# Patient Record
Sex: Female | Born: 1950 | Race: White | Hispanic: No | Smoking: Never smoker
Health system: Southern US, Community
[De-identification: ages and names within clinical notes are randomized; demographics above are authoritative.]

## PROBLEM LIST (undated history)

## (undated) DIAGNOSIS — M549 Dorsalgia, unspecified: Secondary | ICD-10-CM

## (undated) DIAGNOSIS — F419 Anxiety disorder, unspecified: Secondary | ICD-10-CM

## (undated) DIAGNOSIS — F329 Major depressive disorder, single episode, unspecified: Secondary | ICD-10-CM

## (undated) DIAGNOSIS — K219 Gastro-esophageal reflux disease without esophagitis: Secondary | ICD-10-CM

## (undated) DIAGNOSIS — J189 Pneumonia, unspecified organism: Secondary | ICD-10-CM

## (undated) DIAGNOSIS — I739 Peripheral vascular disease, unspecified: Secondary | ICD-10-CM

## (undated) DIAGNOSIS — R51 Headache: Secondary | ICD-10-CM

## (undated) DIAGNOSIS — T7840XA Allergy, unspecified, initial encounter: Secondary | ICD-10-CM

## (undated) DIAGNOSIS — M797 Fibromyalgia: Secondary | ICD-10-CM

## (undated) DIAGNOSIS — I639 Cerebral infarction, unspecified: Secondary | ICD-10-CM

## (undated) DIAGNOSIS — J45909 Unspecified asthma, uncomplicated: Secondary | ICD-10-CM

## (undated) DIAGNOSIS — I2109 ST elevation (STEMI) myocardial infarction involving other coronary artery of anterior wall: Secondary | ICD-10-CM

## (undated) DIAGNOSIS — G709 Myoneural disorder, unspecified: Secondary | ICD-10-CM

## (undated) DIAGNOSIS — F32A Depression, unspecified: Secondary | ICD-10-CM

## (undated) DIAGNOSIS — G8929 Other chronic pain: Secondary | ICD-10-CM

## (undated) DIAGNOSIS — R5383 Other fatigue: Secondary | ICD-10-CM

## (undated) DIAGNOSIS — M1990 Unspecified osteoarthritis, unspecified site: Secondary | ICD-10-CM

## (undated) DIAGNOSIS — I2510 Atherosclerotic heart disease of native coronary artery without angina pectoris: Secondary | ICD-10-CM

## (undated) DIAGNOSIS — Z9289 Personal history of other medical treatment: Secondary | ICD-10-CM

## (undated) DIAGNOSIS — E785 Hyperlipidemia, unspecified: Secondary | ICD-10-CM

## (undated) DIAGNOSIS — K5900 Constipation, unspecified: Secondary | ICD-10-CM

## (undated) DIAGNOSIS — I10 Essential (primary) hypertension: Secondary | ICD-10-CM

## (undated) DIAGNOSIS — D649 Anemia, unspecified: Secondary | ICD-10-CM

## (undated) HISTORY — DX: Other fatigue: R53.83

## (undated) HISTORY — DX: ST elevation (STEMI) myocardial infarction involving other coronary artery of anterior wall: I21.09

## (undated) HISTORY — PX: HERNIA REPAIR: SHX51

## (undated) HISTORY — DX: Constipation, unspecified: K59.00

## (undated) HISTORY — DX: Essential (primary) hypertension: I10

## (undated) HISTORY — PX: COSMETIC SURGERY: SHX468

## (undated) HISTORY — DX: Personal history of other medical treatment: Z92.89

## (undated) HISTORY — PX: ABDOMINAL HYSTERECTOMY: SHX81

## (undated) HISTORY — DX: Allergy, unspecified, initial encounter: T78.40XA

---

## 1983-07-09 HISTORY — PX: OTHER SURGICAL HISTORY: SHX169

## 1983-07-09 HISTORY — PX: NOSE SURGERY: SHX723

## 1997-10-31 ENCOUNTER — Emergency Department (HOSPITAL_COMMUNITY): Admission: EM | Admit: 1997-10-31 | Discharge: 1997-10-31 | Payer: Self-pay | Admitting: Emergency Medicine

## 1998-02-09 ENCOUNTER — Emergency Department (HOSPITAL_COMMUNITY): Admission: EM | Admit: 1998-02-09 | Discharge: 1998-02-09 | Payer: Self-pay | Admitting: Emergency Medicine

## 1998-03-02 ENCOUNTER — Encounter: Admission: RE | Admit: 1998-03-02 | Discharge: 1998-03-02 | Payer: Self-pay | Admitting: *Deleted

## 1998-09-13 ENCOUNTER — Emergency Department (HOSPITAL_COMMUNITY): Admission: EM | Admit: 1998-09-13 | Discharge: 1998-09-13 | Payer: Self-pay | Admitting: Emergency Medicine

## 1998-09-14 ENCOUNTER — Encounter: Payer: Self-pay | Admitting: Emergency Medicine

## 1998-10-03 ENCOUNTER — Encounter: Payer: Self-pay | Admitting: Emergency Medicine

## 1998-10-03 ENCOUNTER — Emergency Department (HOSPITAL_COMMUNITY): Admission: EM | Admit: 1998-10-03 | Discharge: 1998-10-03 | Payer: Self-pay | Admitting: Emergency Medicine

## 1998-11-12 ENCOUNTER — Emergency Department (HOSPITAL_COMMUNITY): Admission: EM | Admit: 1998-11-12 | Discharge: 1998-11-12 | Payer: Self-pay | Admitting: Internal Medicine

## 1999-04-01 ENCOUNTER — Emergency Department (HOSPITAL_COMMUNITY): Admission: EM | Admit: 1999-04-01 | Discharge: 1999-04-01 | Payer: Self-pay | Admitting: Emergency Medicine

## 1999-04-21 ENCOUNTER — Emergency Department (HOSPITAL_COMMUNITY): Admission: EM | Admit: 1999-04-21 | Discharge: 1999-04-21 | Payer: Self-pay | Admitting: Emergency Medicine

## 1999-04-27 ENCOUNTER — Ambulatory Visit (HOSPITAL_COMMUNITY): Admission: RE | Admit: 1999-04-27 | Discharge: 1999-04-27 | Payer: Self-pay | Admitting: Internal Medicine

## 1999-04-27 ENCOUNTER — Encounter: Payer: Self-pay | Admitting: Internal Medicine

## 1999-05-02 ENCOUNTER — Emergency Department (HOSPITAL_COMMUNITY): Admission: EM | Admit: 1999-05-02 | Discharge: 1999-05-02 | Payer: Self-pay | Admitting: Emergency Medicine

## 1999-09-21 ENCOUNTER — Ambulatory Visit (HOSPITAL_COMMUNITY): Admission: RE | Admit: 1999-09-21 | Discharge: 1999-09-21 | Payer: Self-pay | Admitting: Internal Medicine

## 1999-10-09 ENCOUNTER — Emergency Department (HOSPITAL_COMMUNITY): Admission: EM | Admit: 1999-10-09 | Discharge: 1999-10-09 | Payer: Self-pay | Admitting: Emergency Medicine

## 2000-04-17 ENCOUNTER — Emergency Department (HOSPITAL_COMMUNITY): Admission: EM | Admit: 2000-04-17 | Discharge: 2000-04-17 | Payer: Self-pay | Admitting: Emergency Medicine

## 2000-08-31 ENCOUNTER — Emergency Department (HOSPITAL_COMMUNITY): Admission: EM | Admit: 2000-08-31 | Discharge: 2000-08-31 | Payer: Self-pay | Admitting: Emergency Medicine

## 2001-03-02 ENCOUNTER — Emergency Department (HOSPITAL_COMMUNITY): Admission: EM | Admit: 2001-03-02 | Discharge: 2001-03-02 | Payer: Self-pay | Admitting: Emergency Medicine

## 2001-04-01 ENCOUNTER — Ambulatory Visit (HOSPITAL_COMMUNITY): Admission: RE | Admit: 2001-04-01 | Discharge: 2001-04-01 | Payer: Self-pay | Admitting: Family Medicine

## 2001-04-01 ENCOUNTER — Encounter: Payer: Self-pay | Admitting: Family Medicine

## 2001-04-01 ENCOUNTER — Emergency Department (HOSPITAL_COMMUNITY): Admission: EM | Admit: 2001-04-01 | Discharge: 2001-04-01 | Payer: Self-pay | Admitting: Emergency Medicine

## 2001-04-19 ENCOUNTER — Emergency Department (HOSPITAL_COMMUNITY): Admission: EM | Admit: 2001-04-19 | Discharge: 2001-04-19 | Payer: Self-pay | Admitting: Emergency Medicine

## 2001-05-01 ENCOUNTER — Emergency Department (HOSPITAL_COMMUNITY): Admission: EM | Admit: 2001-05-01 | Discharge: 2001-05-01 | Payer: Self-pay | Admitting: Emergency Medicine

## 2003-07-09 DIAGNOSIS — I2510 Atherosclerotic heart disease of native coronary artery without angina pectoris: Secondary | ICD-10-CM

## 2003-07-09 HISTORY — DX: Atherosclerotic heart disease of native coronary artery without angina pectoris: I25.10

## 2003-07-27 ENCOUNTER — Emergency Department (HOSPITAL_COMMUNITY): Admission: EM | Admit: 2003-07-27 | Discharge: 2003-07-28 | Payer: Self-pay | Admitting: Emergency Medicine

## 2003-08-16 ENCOUNTER — Inpatient Hospital Stay (HOSPITAL_COMMUNITY): Admission: EM | Admit: 2003-08-16 | Discharge: 2003-08-17 | Payer: Self-pay | Admitting: Emergency Medicine

## 2003-08-16 HISTORY — PX: CARDIAC CATHETERIZATION: SHX172

## 2003-08-22 ENCOUNTER — Encounter (INDEPENDENT_AMBULATORY_CARE_PROVIDER_SITE_OTHER): Payer: Self-pay | Admitting: *Deleted

## 2003-08-22 ENCOUNTER — Inpatient Hospital Stay (HOSPITAL_COMMUNITY): Admission: EM | Admit: 2003-08-22 | Discharge: 2003-08-23 | Payer: Self-pay | Admitting: Emergency Medicine

## 2003-08-23 HISTORY — PX: CARDIAC CATHETERIZATION: SHX172

## 2003-10-13 ENCOUNTER — Emergency Department (HOSPITAL_COMMUNITY): Admission: EM | Admit: 2003-10-13 | Discharge: 2003-10-13 | Payer: Self-pay

## 2003-10-14 ENCOUNTER — Ambulatory Visit (HOSPITAL_COMMUNITY): Admission: RE | Admit: 2003-10-14 | Discharge: 2003-10-14 | Payer: Self-pay

## 2003-11-22 ENCOUNTER — Inpatient Hospital Stay (HOSPITAL_COMMUNITY): Admission: EM | Admit: 2003-11-22 | Discharge: 2003-11-23 | Payer: Self-pay | Admitting: Emergency Medicine

## 2003-11-22 HISTORY — PX: CARDIAC CATHETERIZATION: SHX172

## 2004-03-06 ENCOUNTER — Emergency Department (HOSPITAL_COMMUNITY): Admission: EM | Admit: 2004-03-06 | Discharge: 2004-03-06 | Payer: Self-pay | Admitting: Emergency Medicine

## 2004-06-27 ENCOUNTER — Emergency Department (HOSPITAL_COMMUNITY): Admission: EM | Admit: 2004-06-27 | Discharge: 2004-06-27 | Payer: Self-pay | Admitting: Emergency Medicine

## 2004-09-03 ENCOUNTER — Inpatient Hospital Stay (HOSPITAL_COMMUNITY): Admission: EM | Admit: 2004-09-03 | Discharge: 2004-09-04 | Payer: Self-pay | Admitting: Emergency Medicine

## 2004-09-30 ENCOUNTER — Emergency Department (HOSPITAL_COMMUNITY): Admission: EM | Admit: 2004-09-30 | Discharge: 2004-10-01 | Payer: Self-pay | Admitting: Emergency Medicine

## 2004-11-15 ENCOUNTER — Emergency Department (HOSPITAL_COMMUNITY): Admission: EM | Admit: 2004-11-15 | Discharge: 2004-11-15 | Payer: Self-pay | Admitting: Emergency Medicine

## 2005-01-16 ENCOUNTER — Emergency Department (HOSPITAL_COMMUNITY): Admission: EM | Admit: 2005-01-16 | Discharge: 2005-01-16 | Payer: Self-pay | Admitting: Emergency Medicine

## 2005-02-16 ENCOUNTER — Emergency Department (HOSPITAL_COMMUNITY): Admission: EM | Admit: 2005-02-16 | Discharge: 2005-02-16 | Payer: Self-pay | Admitting: Emergency Medicine

## 2005-06-12 ENCOUNTER — Inpatient Hospital Stay (HOSPITAL_COMMUNITY): Admission: EM | Admit: 2005-06-12 | Discharge: 2005-06-14 | Payer: Self-pay | Admitting: Emergency Medicine

## 2005-06-13 HISTORY — PX: CARDIAC CATHETERIZATION: SHX172

## 2005-07-16 ENCOUNTER — Emergency Department (HOSPITAL_COMMUNITY): Admission: EM | Admit: 2005-07-16 | Discharge: 2005-07-16 | Payer: Self-pay | Admitting: Emergency Medicine

## 2005-10-06 ENCOUNTER — Emergency Department (HOSPITAL_COMMUNITY): Admission: EM | Admit: 2005-10-06 | Discharge: 2005-10-06 | Payer: Self-pay | Admitting: Emergency Medicine

## 2008-06-29 ENCOUNTER — Emergency Department (HOSPITAL_COMMUNITY): Admission: EM | Admit: 2008-06-29 | Discharge: 2008-06-29 | Payer: Self-pay | Admitting: Emergency Medicine

## 2008-07-15 ENCOUNTER — Ambulatory Visit: Payer: Self-pay | Admitting: Internal Medicine

## 2008-07-18 ENCOUNTER — Emergency Department (HOSPITAL_COMMUNITY): Admission: EM | Admit: 2008-07-18 | Discharge: 2008-07-18 | Payer: Self-pay | Admitting: Family Medicine

## 2008-08-26 ENCOUNTER — Ambulatory Visit: Payer: Self-pay | Admitting: Family Medicine

## 2008-09-07 ENCOUNTER — Encounter: Payer: Self-pay | Admitting: Family Medicine

## 2008-09-07 ENCOUNTER — Ambulatory Visit (HOSPITAL_COMMUNITY): Admission: RE | Admit: 2008-09-07 | Discharge: 2008-09-07 | Payer: Self-pay | Admitting: Family Medicine

## 2008-09-07 ENCOUNTER — Ambulatory Visit: Payer: Self-pay | Admitting: *Deleted

## 2008-09-13 ENCOUNTER — Ambulatory Visit: Payer: Self-pay | Admitting: Internal Medicine

## 2008-09-13 ENCOUNTER — Encounter: Payer: Self-pay | Admitting: Family Medicine

## 2008-09-13 LAB — CONVERTED CEMR LAB
ALT: 19 U/L (ref 0–35)
AST: 20 U/L (ref 0–37)
Albumin: 4.3 g/dL (ref 3.5–5.2)
Alkaline Phosphatase: 131 U/L — ABNORMAL HIGH (ref 39–117)
BUN: 16 mg/dL (ref 6–23)
Band Neutrophils: 0 % (ref 0–10)
Basophils Absolute: 0 K/uL (ref 0.0–0.1)
Basophils Relative: 0 % (ref 0–1)
CO2: 20 meq/L (ref 19–32)
Calcium: 9.2 mg/dL (ref 8.4–10.5)
Chloride: 105 meq/L (ref 96–112)
Cholesterol: 207 mg/dL — ABNORMAL HIGH (ref 0–200)
Creatinine, Ser: 0.85 mg/dL (ref 0.40–1.20)
Eosinophils Absolute: 1 K/uL — ABNORMAL HIGH (ref 0.0–0.7)
Eosinophils Relative: 9 % — ABNORMAL HIGH (ref 0–5)
Glucose, Bld: 95 mg/dL (ref 70–99)
HCT: 42.8 % (ref 36.0–46.0)
HDL: 70 mg/dL (ref >39)
Hemoglobin: 13.8 g/dL (ref 12.0–15.0)
LDL Cholesterol: 115 mg/dL — ABNORMAL HIGH (ref 0–99)
Lymphocytes Relative: 26 % (ref 12–46)
Lymphs Abs: 2.8 K/uL (ref 0.7–4.0)
MCHC: 32.2 g/dL (ref 30.0–36.0)
MCV: 93.4 fL (ref 78.0–100.0)
Monocytes Absolute: 0.6 K/uL (ref 0.1–1.0)
Monocytes Relative: 5 % (ref 3–12)
Neutro Abs: 6.1 K/uL (ref 1.7–7.7)
Neutrophils Relative %: 58 % (ref 43–77)
Platelets: 501 K/uL — ABNORMAL HIGH (ref 150–400)
Potassium: 4.3 meq/L (ref 3.5–5.3)
Pro B Natriuretic peptide (BNP): 11.6 pg/mL (ref 0.0–100.0)
RBC: 4.58 M/uL (ref 3.87–5.11)
RDW: 14.4 % (ref 11.5–15.5)
Sodium: 140 meq/L (ref 135–145)
TSH: 4.266 u[IU]/mL (ref 0.350–4.500)
Total Bilirubin: 0.4 mg/dL (ref 0.3–1.2)
Total CHOL/HDL Ratio: 3
Total Protein: 7.1 g/dL (ref 6.0–8.3)
Triglycerides: 112 mg/dL (ref <150)
VLDL: 22 mg/dL (ref 0–40)
Vit D, 25-Hydroxy: 21 ng/mL — ABNORMAL LOW (ref 30–89)
Vitamin B-12: 485 pg/mL (ref 211–911)
WBC: 10.5 10*3/microliter (ref 4.0–10.5)

## 2008-09-21 ENCOUNTER — Ambulatory Visit: Payer: Self-pay | Admitting: Internal Medicine

## 2008-09-22 ENCOUNTER — Ambulatory Visit: Payer: Self-pay | Admitting: *Deleted

## 2008-09-28 ENCOUNTER — Emergency Department (HOSPITAL_COMMUNITY): Admission: EM | Admit: 2008-09-28 | Discharge: 2008-09-28 | Payer: Self-pay | Admitting: Emergency Medicine

## 2008-10-07 ENCOUNTER — Emergency Department (HOSPITAL_COMMUNITY): Admission: EM | Admit: 2008-10-07 | Discharge: 2008-10-07 | Payer: Self-pay | Admitting: Emergency Medicine

## 2008-10-12 ENCOUNTER — Emergency Department (HOSPITAL_COMMUNITY): Admission: EM | Admit: 2008-10-12 | Discharge: 2008-10-12 | Payer: Self-pay | Admitting: Emergency Medicine

## 2008-11-03 ENCOUNTER — Ambulatory Visit: Payer: Self-pay | Admitting: Internal Medicine

## 2008-12-07 ENCOUNTER — Observation Stay (HOSPITAL_COMMUNITY): Admission: EM | Admit: 2008-12-07 | Discharge: 2008-12-08 | Payer: Self-pay | Admitting: Emergency Medicine

## 2008-12-10 ENCOUNTER — Ambulatory Visit: Payer: Self-pay | Admitting: *Deleted

## 2008-12-10 ENCOUNTER — Inpatient Hospital Stay (HOSPITAL_COMMUNITY): Admission: AD | Admit: 2008-12-10 | Discharge: 2008-12-14 | Payer: Self-pay | Admitting: *Deleted

## 2008-12-12 ENCOUNTER — Ambulatory Visit (HOSPITAL_COMMUNITY): Admission: RE | Admit: 2008-12-12 | Discharge: 2008-12-12 | Payer: Self-pay | Admitting: *Deleted

## 2009-01-02 ENCOUNTER — Ambulatory Visit: Payer: Self-pay | Admitting: Internal Medicine

## 2009-01-17 ENCOUNTER — Emergency Department (HOSPITAL_COMMUNITY): Admission: EM | Admit: 2009-01-17 | Discharge: 2009-01-17 | Payer: Self-pay | Admitting: Ophthalmology

## 2009-02-01 ENCOUNTER — Ambulatory Visit: Payer: Self-pay | Admitting: Internal Medicine

## 2009-02-14 ENCOUNTER — Emergency Department (HOSPITAL_COMMUNITY): Admission: EM | Admit: 2009-02-14 | Discharge: 2009-02-15 | Payer: Self-pay | Admitting: Emergency Medicine

## 2009-02-15 ENCOUNTER — Telehealth (INDEPENDENT_AMBULATORY_CARE_PROVIDER_SITE_OTHER): Payer: Self-pay | Admitting: *Deleted

## 2009-02-15 ENCOUNTER — Ambulatory Visit: Payer: Self-pay | Admitting: Internal Medicine

## 2009-02-15 ENCOUNTER — Encounter (INDEPENDENT_AMBULATORY_CARE_PROVIDER_SITE_OTHER): Payer: Self-pay | Admitting: Adult Health

## 2009-02-15 LAB — CONVERTED CEMR LAB

## 2009-02-16 ENCOUNTER — Encounter (INDEPENDENT_AMBULATORY_CARE_PROVIDER_SITE_OTHER): Payer: Self-pay | Admitting: Adult Health

## 2009-05-31 ENCOUNTER — Ambulatory Visit: Payer: Self-pay | Admitting: Family Medicine

## 2009-05-31 ENCOUNTER — Encounter: Payer: Self-pay | Admitting: Family Medicine

## 2009-05-31 LAB — CONVERTED CEMR LAB: Microalb, Ur: 3.72 mg/dL — ABNORMAL HIGH (ref 0.00–1.89)

## 2009-07-12 ENCOUNTER — Ambulatory Visit: Payer: Self-pay | Admitting: Internal Medicine

## 2009-07-20 ENCOUNTER — Encounter (INDEPENDENT_AMBULATORY_CARE_PROVIDER_SITE_OTHER): Payer: Self-pay | Admitting: *Deleted

## 2009-08-04 ENCOUNTER — Ambulatory Visit (HOSPITAL_COMMUNITY): Admission: RE | Admit: 2009-08-04 | Discharge: 2009-08-04 | Payer: Self-pay | Admitting: Internal Medicine

## 2009-08-14 ENCOUNTER — Ambulatory Visit: Payer: Self-pay | Admitting: Gastroenterology

## 2009-08-14 DIAGNOSIS — K602 Anal fissure, unspecified: Secondary | ICD-10-CM

## 2009-08-17 ENCOUNTER — Ambulatory Visit: Payer: Self-pay | Admitting: Gastroenterology

## 2010-04-12 ENCOUNTER — Emergency Department (HOSPITAL_COMMUNITY): Admission: EM | Admit: 2010-04-12 | Discharge: 2010-04-12 | Payer: Self-pay | Admitting: Emergency Medicine

## 2010-05-19 ENCOUNTER — Inpatient Hospital Stay (HOSPITAL_COMMUNITY)
Admission: EM | Admit: 2010-05-19 | Discharge: 2010-05-21 | Payer: Self-pay | Source: Home / Self Care | Admitting: Emergency Medicine

## 2010-05-21 HISTORY — PX: CARDIAC CATHETERIZATION: SHX172

## 2010-07-05 IMAGING — CR DG CHEST 1V PORT
1 series · 1 of 1 positions shown · non-contrast
Comparison: 10/06/2005

CLINICAL DATA: Chest pain and shortness of breath

CHEST - 1 VIEW

[view not recorded]
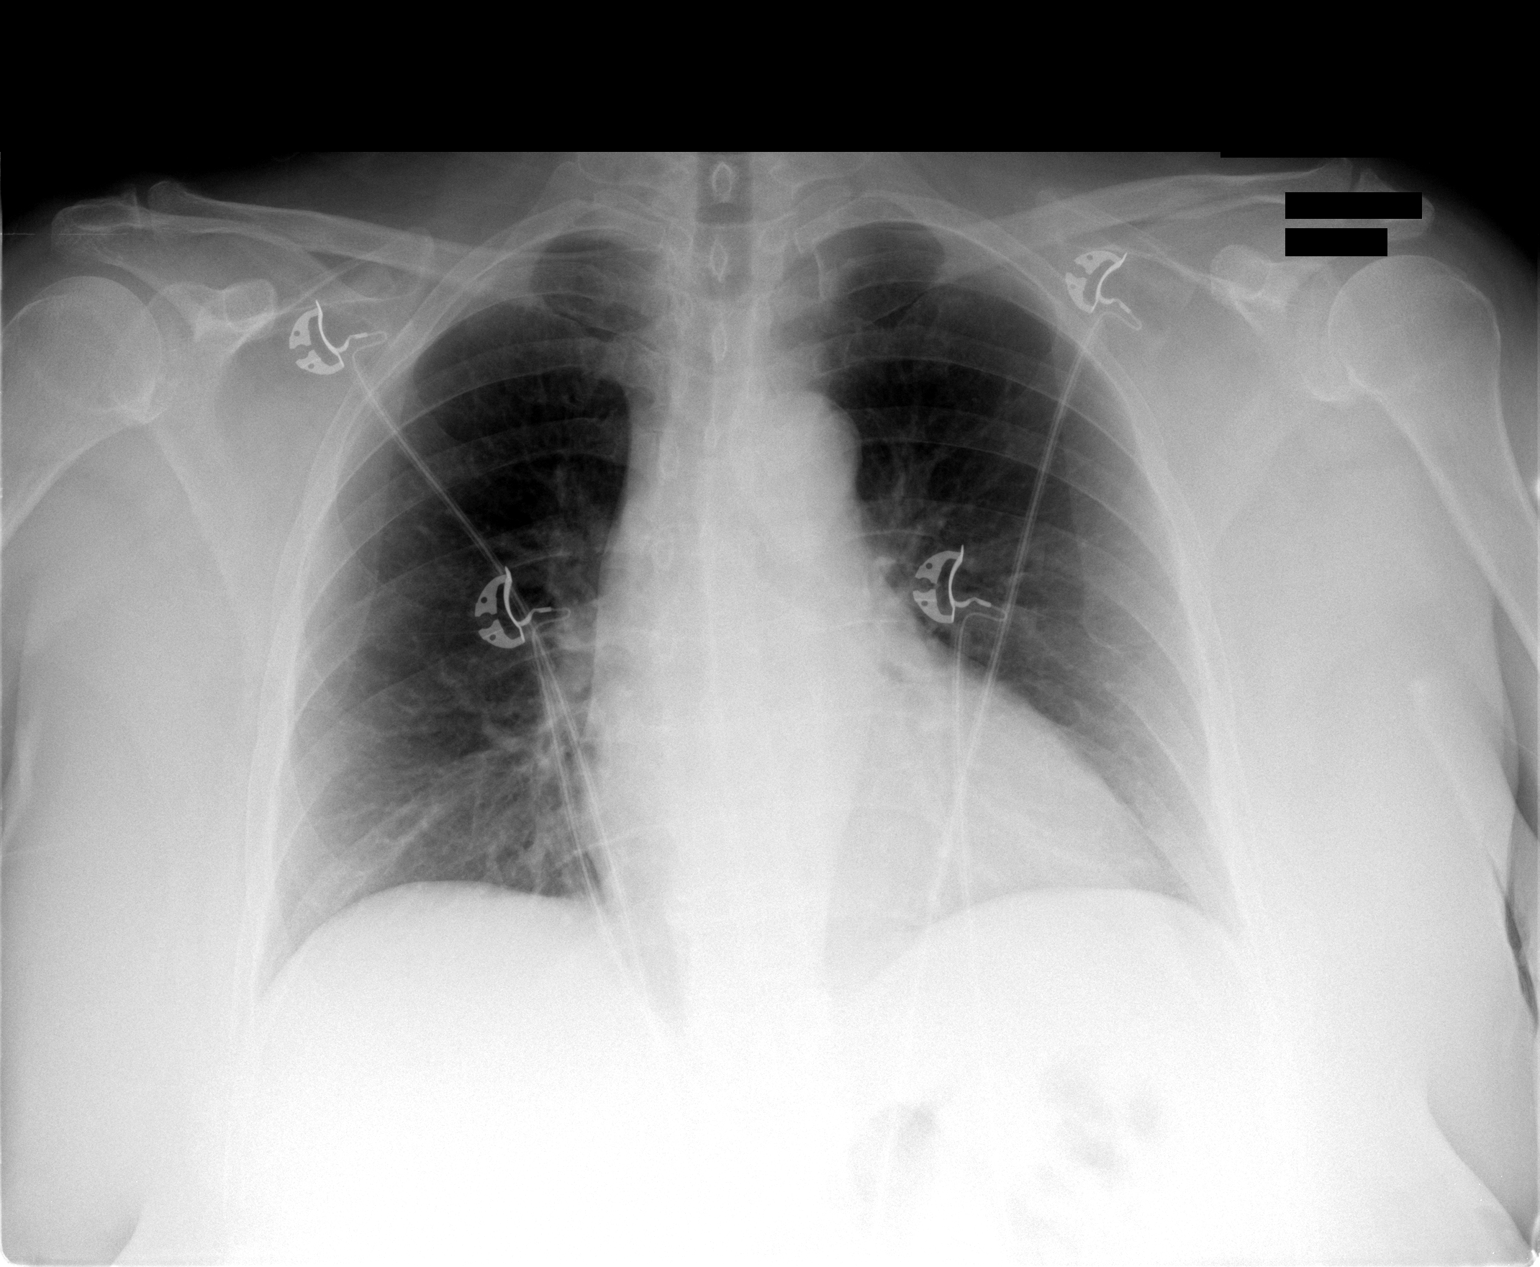

[1 of 1 positions shown; findings below may reference images not displayed]

FINDINGS: The heart size and mediastinal contours are within normal
limits.  Both lungs are clear.
IMPRESSION: No active disease.

## 2010-07-29 ENCOUNTER — Encounter: Payer: Self-pay | Admitting: Internal Medicine

## 2010-07-30 ENCOUNTER — Encounter: Payer: Self-pay | Admitting: Family Medicine

## 2010-08-09 NOTE — Letter (Signed)
Summary: New Patient letter  [facility]  [address]   [place], [place] [address]   Phone: [phone]  Fax: [phone]       07/20/2009 MRN: [id number]  [name] [address] [place], [place]  [address]  Dear Ms. Luffman,  Welcome to the Gastroenterology Division at [facility].    You are scheduled to see Dr.  KAPLAN  on 08-14-09 at 11:15AM on the 3rd floor at [facility], [address].  We ask that you try to arrive at our office 15 minutes prior to your appointment time to allow for check-in.  We would like you to complete the enclosed self-administered evaluation form prior to your visit and bring it with you on the day of your appointment.  We will review it with you.  Also, please bring a complete list of all your medications or, if you prefer, bring the medication bottles and we will list them.  Please bring your insurance card so that we may make a copy of it.  If your insurance requires a referral to see a specialist, please bring your referral form from your primary care physician.  Co-payments are due at the time of your visit and may be paid by cash, check or credit card.     Your office visit will consist of a consult with your physician (includes a physical exam), any laboratory testing he/she may order, scheduling of any necessary diagnostic testing (e.g. x-ray, ultrasound, CT-scan), and scheduling of a procedure (e.g. Endoscopy, Colonoscopy) if required.  Please allow enough time on your schedule to allow for any/all of these possibilities.    If you cannot keep your appointment, please call [phone] to cancel or reschedule prior to your appointment date.  This allows us the opportunity to schedule an appointment for another patient in need of care.  If you do not cancel or reschedule by 5 p.m. the business day prior to your appointment date, you will be charged a $50.00 late cancellation/no-show fee.    Thank you for choosing [facility] for your medical needs.  We appreciate the opportunity to care for you.  Please visit us at our website  to learn more about our practice.                     Sincerely,                                                             The Gastroenterology Division  

## 2010-08-09 NOTE — Assessment & Plan Note (Signed)
Summary: WEIGHT LOSS/ +STOOL CARDS/PAIN W/BM/YF   History of Present Illness Visit Type: Initial Consult Primary GI MD: Melvia Heaps MD [employer] Primary Provider: Donia Guiles, MD Requesting Provider: Donia Guiles, MD Chief Complaint: Pt. c/o sharp rectal pain with blood in stools on and off x 1 year Dark red in color History of Present Illness:   Ms. Mendel visit 60 year old white female referred at the request of Dr. Reche Dixon for evaluation of rectal bleeding.  For the last year she has been complaining of rectal pain both with and without a bowel movement.  She often has blood accompanying her stools.  Pain is described as sharp and achy.  She tested Hemoccult positive.   GI Review of Systems    Reports abdominal pain, acid reflux, belching, bloating, nausea, and  vomiting.      Denies chest pain, dysphagia with liquids, dysphagia with solids, heartburn, loss of appetite, vomiting blood, weight loss, and  weight gain.      Reports diarrhea, hemorrhoids, rectal bleeding, and  rectal pain.     Denies anal fissure, black tarry stools, change in bowel habit, constipation, diverticulosis, fecal incontinence, heme positive stool, irritable bowel syndrome, jaundice, light color stool, and  liver problems. Da Preventive Screening-Counseling & Management  Alcohol-Tobacco     Smoking Status: never      Drug Use:  no.      Current Medications (verified): 1)  Lisinopril 20 Mg Tabs (Lisinopril) .Marland Kitchen.. 1 Tablet By Mouth Once Daily 2)  Tramadol Hcl 50 Mg Tabs (Tramadol Hcl) .Marland Kitchen.. 1 Tablet By Mouth Three Times A Day 3)  Nitrostat 0.3 Mg Subl (Nitroglycerin) .... Take As Needed 4)  Naprosyn 250 Mg Tabs (Naproxen) .... Take As Needed  Allergies (verified): No Known Drug Allergies  Past History:  Past Medical History: Anal Fissure Anxiety Disorder Arthritis Asthma Depression Fibromyalgia  Past Surgical History: Appendectomy Angioplasty/stent Hernia Surgery Hysterectomy  Family  History: Reviewed history and no changes required. Family History of Heart Disease: mother and father brother  Social History: Reviewed history and no changes required. Occupation: disabled divorced 1 boy Patient has never smoked.  Alcohol Use - no Illicit Drug Use - no Daily Caffeine Use  4 per day Smoking Status:  never Drug Use:  no  Review of Systems       The patient complains of allergy/sinus, anxiety-new, arthritis/joint pain, back pain, depression-new, fatigue, headaches-new, itching, night sweats, swelling of feet/legs, and thirst - excessive.  The patient denies blood in urine, breast changes/lumps, change in vision, confusion, cough, coughing up blood, fainting, fever, hearing problems, heart murmur, heart rhythm changes, menstrual pain, muscle pains/cramps, nosebleeds, pregnancy symptoms, shortness of breath, skin rash, sleeping problems, sore throat, swollen lymph glands, thirst - excessive , urination - excessive , urination changes/pain, urine leakage, vision changes, and voice change.         All other systems were reviewed and were negative   Vital Signs:  Patient profile:   60 year old female Height:      64 inches Weight:      205.4 pounds BMI:     35.38 Pulse rate:   64 / minute Pulse rhythm:   regular BP sitting:   132 / 90  (right arm) Cuff size:   large  Vitals Entered By: Milford Cage NCMA (August 14, 2009 11:51 AM)  Physical Exam  Additional Exam:  She is a well-developed well-nourished female  skin: anicteric HEENT: normocephalic; PEERLA; no nasal or pharyngeal abnormalities neck: supple  nodes: no cervical lymphadenopathy chest: clear to ausculatation and percussion heart: no murmurs, gallops, or rubs abd: soft, nontender; BS normoactive; no abdominal masses, tenderness, organomegaly rectal: no masses; there is a sentinel pile and a fissure located at the posterior midline ext: no cynanosis, clubbing, edema skeletal: no deformities neuro:  oriented x 3; no focal abnormalities    Impression & Recommendations:  Problem # 1:  ANAL FISSURE (ICD-565.0) Plan warm soaks and suppositories.  If she is not improved then I will proceed with Botox injection (pt cannot afford expensive meds including topical diltiazem or nitrates) Orders: Colonoscopy (Colon)  Problem # 2:  NONSPECIFIC ABNORMAL FINDING IN STOOL CONTENTS (ICD-792.1) Limited  rectal bleeding could be due to her fissure.  A more proximal colonic bleeding source should be ruled out.  Recommendations #1 colonoscopy  Risks, complications and alternatives to the procedure were explained to the patient, including bleeding, perforation, and the possible need for surgery.  Patients questions were answered. Orders: Colonoscopy (Colon)  Patient Instructions: 1)  Your Colonoscopy is scheduled for 08/17/2009 at 2:30pm 2)  You can pick up your MoviPrep from your pharmacy today 3)  The medication list was reviewed and reconciled.  All changed / newly prescribed medications were explained.  A complete medication list was provided to the patient / caregiver. Prescriptions: ANUSOL-HC 25 MG SUPP (HYDROCORTISONE ACETATE) take one suppository q.h.s.  #14 x 2   Entered and Authorized by:   Louis Meckel MD   Signed by:   Louis Meckel MD on 08/14/2009   Method used:   Electronically to        [facility] [address] (retail)       [address]       [place], [place]  [id number]       Ph: [phone]       Fax: [phone]   RxID:   [id number] MOVIPREP 100 GM  SOLR (PEG-KCL-NACL-NASULF-NA ASC-C) As per prep instructions.  #1 x 0   Entered by:   Merri Ray CMA (AAMA)   Authorized by:   Louis Meckel MD   Signed by:   Merri Ray CMA (AAMA) on 08/14/2009   Method used:   Electronically to        [facility] [address] (retail)       [address]       [place], [place]  [id number]       Ph: [phone]       Fax: [phone]    RxID:   [id number]

## 2010-08-09 NOTE — Letter (Signed)
Summary: New Patient letter  [facility]  [address] [place], [place] [address]   Phone: [phone]  Fax: [phone]       07/20/2009 MRN: [id number]  [name] [address] [place], [place]  [address]  Dear [name],  Welcome to the Gastroenterology Division at [facility].    You are scheduled to see Dr.  Arlyce Dice  on 08-14-09 at 11:15AM on the 3rd floor at [facility], [address]  We ask that you try to arrive at our office 15 minutes prior to your appointment time to allow for check-in.  We would like you to complete the enclosed self-administered evaluation form prior to your visit and bring it with you on the day of your appointment.  We will review it with you.  Also, please bring a complete list of all your medications or, if you prefer, bring the medication bottles and we will list them.  Please bring your insurance card so that we may make a copy of it.  If your insurance requires a referral to see a specialist, please bring your referral form from your primary care physician.  Co-payments are due at the time of your visit and may be paid by cash, check or credit card.     Your office visit will consist of a consult with your physician (includes a physical exam), any laboratory testing he/she may order, scheduling of any necessary diagnostic testing (e.g. x-ray, ultrasound, CT-scan), and scheduling of a procedure (e.g. Endoscopy, Colonoscopy) if required.  Please allow enough time on your schedule to allow for any/all of these possibilities.    If you cannot keep your appointment, please call [phone] to cancel or reschedule prior to your appointment date.  This allows [place] the opportunity to schedule an appointment for another patient in need of care.  If you do not cancel or reschedule by 5 p.m. the business day prior to your appointment date, you will be charged a $50.00 late cancellation/no-show fee.    Thank you for choosing [facility] for your medical needs.  We appreciate the opportunity to care for you.  Please visit [place] at our website  to learn more about our practice.                     Sincerely,                                                             The Gastroenterology Division

## 2010-08-09 NOTE — Letter (Signed)
Summary: [facility] Instructions  [facility]  [address] [place], [place] [address]   Phone: [phone]  Fax: [phone]       [name]    1950/10/01    MRN: [id number]        Procedure Day /Date:THURSDAY 08/17/2009     Arrival Time:1:30PM     Procedure Time:2:30PM     Location of Procedure:                    X  [facility] (4th Floor)                       PREPARATION FOR COLONOSCOPY WITH MOVIPREP   Starting 5 days prior to your procedure 08/12/2009 do not eat nuts, seeds, popcorn, corn, beans, peas,  salads, or any raw vegetables.  Do not take any fiber supplements (e.g. Metamucil, Citrucel, and Benefiber).  THE DAY BEFORE YOUR PROCEDURE         DATE: 08/16/2009  DAY: WED  1.  Drink clear liquids the entire day-NO SOLID FOOD  2.  Do not drink anything colored red or purple.  Avoid juices with pulp.  No orange juice.  3.  Drink at least 64 oz. (8 glasses) of fluid/clear liquids during the day to prevent dehydration and help the prep work efficiently.  CLEAR LIQUIDS INCLUDE: Water Jello Ice Popsicles Tea (sugar ok, no milk/cream) Powdered fruit flavored drinks Coffee (sugar ok, no milk/cream) Gatorade Juice: apple, white grape, white cranberry  Lemonade Clear bullion, consomm, broth Carbonated beverages (any kind) Strained chicken noodle soup Hard Candy                             4.  In the morning, mix first dose of MoviPrep solution:    Empty 1 Pouch A and 1 Pouch B into the disposable container    Add lukewarm drinking water to the top line of the container. Mix to dissolve    Refrigerate (mixed solution should be used within 24 hrs)  5.  Begin drinking the prep at 5:00 p.m. The MoviPrep container is divided by 4 marks.   Every 15 minutes drink the solution down to the next mark (approximately 8 oz) until the full liter is complete.   6.  Follow completed prep with 16 oz of clear liquid of your choice (Nothing red or  purple).  Continue to drink clear liquids until bedtime.  7.  Before going to bed, mix second dose of MoviPrep solution:    Empty 1 Pouch A and 1 Pouch B into the disposable container    Add lukewarm drinking water to the top line of the container. Mix to dissolve    Refrigerate  THE DAY OF YOUR PROCEDURE      DATE: 08/17/2009 [id number]  Beginning at 9:30a.m. (5 hours before procedure):         1. Every 15 minutes, drink the solution down to the next mark (approx 8 oz) until the full liter is complete.  2. Follow completed prep with 16 oz. of clear liquid of your choice.    3. You may drink clear liquids until 12:30PM(2 HOURS BEFORE PROCEDURE).   MEDICATION INSTRUCTIONS  Unless otherwise instructed, you should take regular prescription medications with a small sip of water   as early as possible the morning of your procedure.  OTHER INSTRUCTIONS  You will need a responsible adult at least 60 years of age to accompany you and drive you home.   This person must remain in the waiting room during your procedure.  Wear loose fitting clothing that is easily removed.  Leave jewelry and other valuables at home.  However, you may wish to bring a book to read or  an iPod/MP3 player to listen to music as you wait for your procedure to start.  Remove all body piercing jewelry and leave at home.  Total time from sign-in until discharge is approximately 2-3 hours.  You should go home directly after your procedure and rest.  You can resume normal activities the  day after your procedure.  The day of your procedure you should not:   Drive   Make legal decisions   Operate machinery   Drink alcohol   Return to work  You will receive specific instructions about eating, activities and medications before you leave.    The above instructions have been reviewed and explained to me by   _______________________    I fully understand and can verbalize these  instructions _____________________________ Date _________

## 2010-08-09 NOTE — Procedures (Signed)
Summary: Colonoscopy  Patient: Aislin Onofre Note: All result statuses are Final unless otherwise noted.  Tests: (1) Colonoscopy (COL)   COL Colonoscopy           DONE     [facility]     [address]     [place], [place]  [address]           COLONOSCOPY PROCEDURE REPORT           PATIENT:  Talon, Witting  MR#:  [id number]     BIRTHDATE:  1950-12-30, 58 yrs. old  GENDER:  female           ENDOSCOPIST:  Barbette Hair. Arlyce Dice, MD     Referred by:           PROCEDURE DATE:  08/17/2009     PROCEDURE:  Colonoscopy, Diagnostic     ASA CLASS:  Class II     INDICATIONS:  rectal bleeding           MEDICATIONS:   Fentanyl 75 mcg IV, Versed 8 mg           DESCRIPTION OF PROCEDURE:   After the risks benefits and     alternatives of the procedure were thoroughly explained, informed     consent was obtained.  Digital rectal exam was performed and     revealed no abnormalities.   The  endoscope was introduced through     the anus and advanced to the cecum, which was identified by both     the appendix and ileocecal valve, limited by poor preparation.     Large amount of retained, liquid stool limiting detailed     examination of the mucosa  The quality of the prep was poor, using     MoviPrep.  The instrument was then slowly withdrawn as the colon     was fully examined.     <<PROCEDUREIMAGES>>           FINDINGS:  A normal appearing cecum, ileocecal valve, and     appendiceal orifice were identified. The ascending, hepatic     flexure, transverse, splenic flexure, descending, sigmoid colon,     and rectum appeared unremarkable (see image1, image2, image3,     image5, image6, and image7).   Retroflexed views in the rectum     revealed no abnormalities.    The scope was then withdrawn from     the patient and the procedure completed.           COMPLICATIONS:  None           ENDOSCOPIC IMPRESSION:     1) Normal colon     2) rectal bleeding probably secondary to anal fissure  RECOMMENDATIONS:     1) continue current medications     2) call office next 1-3 days to schedule followup visit in 3     weeks           REPEAT EXAM:  No           ______________________________     Barbette Hair. Arlyce Dice, MD           CC:  Donia Guiles, MD           n.     Rosalie Doctor:   Barbette Hair. Addam Goeller at 08/17/2009 03:16 PM           Salvatore Decent, [phone]  Note: An exclamation mark (!) indicates a result that was not dispersed into the  flowsheet. Document Creation Date: 08/17/2009 3:16 PM _______________________________________________________________________  (1) Order result status: Final Collection or observation date-time: 08/17/2009 15:09 Requested date-time:  Receipt date-time:  Reported date-time:  Referring Physician:   Ordering Physician: Melvia Heaps [phone]) Specimen Source:  Source: Launa Grill Order Number: [id number] Lab site:

## 2010-08-09 NOTE — Letter (Signed)
Summary: Results Letter  [facility]  [address] [place], [place] [address]   Phone: [phone]  Fax: [phone]        August 14, 2009 MRN: [id number]    [name] [address] [place], [place]  [address]    Dear Ms. Ringgold,  It is my pleasure to have treated you recently as a new patient in my office. I appreciate your confidence and the opportunity to participate in your care.  Since I do have a busy inpatient endoscopy schedule and office schedule, my office hours vary weekly. I am, however, available for emergency calls everyday through my office. If I am not available for an urgent office appointment, another one of our gastroenterologist will be able to assist you.  My well-trained staff are prepared to help you at all times. For emergencies after office hours, a physician from our Gastroenterology section is always available through my 24 hour answering service  Once again I welcome you as a new patient and I look forward to a happy and healthy relationship             Sincerely,  Louis Meckel MD  This letter has been electronically signed by your physician.  Appended Document: Results Letter LETTER MAILED

## 2010-09-19 LAB — CBC
HCT: 36.8 % (ref 36.0–46.0)
HCT: 38 % (ref 36.0–46.0)
Hemoglobin: 12.5 g/dL (ref 12.0–15.0)
MCH: 30.4 pg (ref 26.0–34.0)
MCH: 30.7 pg (ref 26.0–34.0)
MCHC: 32.1 g/dL (ref 30.0–36.0)
MCHC: 32.3 g/dL (ref 30.0–36.0)
MCV: 95.1 fL (ref 78.0–100.0)
Platelets: 326 10*3/uL (ref 150–400)
RBC: 4.04 MIL/uL (ref 3.87–5.11)
RDW: 13.1 % (ref 11.5–15.5)
RDW: 13.4 % (ref 11.5–15.5)
WBC: 13.9 10*3/uL — ABNORMAL HIGH (ref 4.0–10.5)

## 2010-09-19 LAB — BRAIN NATRIURETIC PEPTIDE: Pro B Natriuretic peptide (BNP): <30 pg/mL (ref 0.0–100.0)

## 2010-09-19 LAB — URINALYSIS, ROUTINE W REFLEX MICROSCOPIC
Bilirubin Urine: NEGATIVE
Ketones, ur: NEGATIVE mg/dL
Nitrite: NEGATIVE
Specific Gravity, Urine: 1.019 (ref 1.005–1.030)
Urobilinogen, UA: 1 mg/dL (ref 0.0–1.0)

## 2010-09-19 LAB — POCT CARDIAC MARKERS
CKMB, poc: <1 ng/mL — ABNORMAL LOW (ref 1.0–8.0)
Troponin i, poc: <0.05 ng/mL (ref 0.00–0.09)

## 2010-09-19 LAB — BASIC METABOLIC PANEL
BUN: 14 mg/dL (ref 6–23)
BUN: 14 mg/dL (ref 6–23)
CO2: 29 mEq/L (ref 19–32)
Calcium: 8.6 mg/dL (ref 8.4–10.5)
Creatinine, Ser: 1.02 mg/dL (ref 0.4–1.2)
Creatinine, Ser: 1.03 mg/dL (ref 0.4–1.2)
GFR calc Af Amer: >60 mL/min (ref >60)
GFR calc non Af Amer: 51 mL/min — ABNORMAL LOW (ref >60)
GFR calc non Af Amer: 55 mL/min — ABNORMAL LOW (ref >60)
Glucose, Bld: 99 mg/dL (ref 70–99)
Potassium: 4.4 mEq/L (ref 3.5–5.1)
Potassium: 4.4 mEq/L (ref 3.5–5.1)
Sodium: 136 mEq/L (ref 135–145)

## 2010-09-19 LAB — HEMOGLOBIN A1C
Hgb A1c MFr Bld: 5.8 % — ABNORMAL HIGH
Mean Plasma Glucose: 120 mg/dL — ABNORMAL HIGH

## 2010-09-19 LAB — URINE MICROSCOPIC-ADD ON

## 2010-09-19 LAB — DIFFERENTIAL
Basophils Absolute: 0.1 10*3/uL (ref 0.0–0.1)
Lymphocytes Relative: 12 % (ref 12–46)
Monocytes Absolute: 0.8 10*3/uL (ref 0.1–1.0)
Neutro Abs: 10.9 10*3/uL — ABNORMAL HIGH (ref 1.7–7.7)

## 2010-09-19 LAB — HEPATIC FUNCTION PANEL
ALT: 74 U/L — ABNORMAL HIGH (ref 0–35)
AST: 109 U/L — ABNORMAL HIGH (ref 0–37)
Albumin: 3.3 g/dL — ABNORMAL LOW (ref 3.5–5.2)
Alkaline Phosphatase: 114 U/L (ref 39–117)
Bilirubin, Direct: 0.1 mg/dL (ref 0.0–0.3)
Indirect Bilirubin: 0.1 mg/dL — ABNORMAL LOW (ref 0.3–0.9)
Total Bilirubin: 0.2 mg/dL — ABNORMAL LOW (ref 0.3–1.2)
Total Protein: 6.6 g/dL (ref 6.0–8.3)

## 2010-09-19 LAB — MAGNESIUM: Magnesium: 2 mg/dL (ref 1.5–2.5)

## 2010-09-19 LAB — HEPARIN LEVEL (UNFRACTIONATED)
Heparin Unfractionated: 0.21 IU/mL — ABNORMAL LOW (ref 0.30–0.70)
Heparin Unfractionated: 0.42 IU/mL (ref 0.30–0.70)

## 2010-09-19 LAB — CARDIAC PANEL(CRET KIN+CKTOT+MB+TROPI)
CK, MB: 0.4 ng/mL (ref 0.3–4.0)
Total CK: 30 U/L (ref 7–177)
Troponin I: 0.05 ng/mL (ref 0.00–0.06)
Troponin I: <0.01 ng/mL (ref 0.00–0.06)

## 2010-09-19 LAB — LIPID PANEL
HDL: 57 mg/dL (ref >39)
Triglycerides: 122 mg/dL (ref <150)
VLDL: 24 mg/dL (ref 0–40)

## 2010-09-19 LAB — HEPATITIS PANEL, ACUTE: HCV Ab: NEGATIVE

## 2010-09-19 LAB — MRSA PCR SCREENING: MRSA by PCR: NEGATIVE

## 2010-09-19 LAB — CK TOTAL AND CKMB (NOT AT ARMC)
CK, MB: 0.4 ng/mL (ref 0.3–4.0)
Relative Index: INVALID (ref 0.0–2.5)
Total CK: 31 U/L (ref 7–177)

## 2010-09-19 LAB — TSH: TSH: 7.303 u[IU]/mL — ABNORMAL HIGH (ref 0.350–4.500)

## 2010-09-19 LAB — LIPASE, BLOOD: Lipase: 26 U/L (ref 11–59)

## 2010-09-19 LAB — PROTIME-INR
INR: 1.05 (ref 0.00–1.49)
Prothrombin Time: 13.9 s (ref 11.6–15.2)

## 2010-09-20 LAB — URINE MICROSCOPIC-ADD ON

## 2010-09-20 LAB — URINALYSIS, ROUTINE W REFLEX MICROSCOPIC
Glucose, UA: NEGATIVE mg/dL
Hgb urine dipstick: NEGATIVE
Specific Gravity, Urine: 1.031 — ABNORMAL HIGH (ref 1.005–1.030)
Urobilinogen, UA: 1 mg/dL (ref 0.0–1.0)

## 2010-10-14 LAB — DIFFERENTIAL
Basophils Absolute: 0 10*3/uL (ref 0.0–0.1)
Basophils Relative: 1 % (ref 0–1)
Eosinophils Absolute: 0.5 10*3/uL (ref 0.0–0.7)
Neutro Abs: 5 10*3/uL (ref 1.7–7.7)
Neutrophils Relative %: 59 % (ref 43–77)

## 2010-10-14 LAB — CBC
MCHC: 34.3 g/dL (ref 30.0–36.0)
MCV: 91.2 fL (ref 78.0–100.0)
Platelets: 389 10*3/uL (ref 150–400)
RBC: 4.39 MIL/uL (ref 3.87–5.11)
RDW: 12.9 % (ref 11.5–15.5)

## 2010-10-14 LAB — BASIC METABOLIC PANEL
BUN: 13 mg/dL (ref 6–23)
CO2: 28 mEq/L (ref 19–32)
Calcium: 9 mg/dL (ref 8.4–10.5)
Creatinine, Ser: 1.04 mg/dL (ref 0.4–1.2)
GFR calc Af Amer: >60 mL/min (ref >60)

## 2010-10-15 LAB — BASIC METABOLIC PANEL
BUN: 14 mg/dL (ref 6–23)
BUN: 9 mg/dL (ref 6–23)
CO2: 31 mEq/L (ref 19–32)
Calcium: 9.4 mg/dL (ref 8.4–10.5)
Chloride: 102 mEq/L (ref 96–112)
Creatinine, Ser: 0.9 mg/dL (ref 0.4–1.2)
GFR calc Af Amer: >60 mL/min (ref >60)
GFR calc non Af Amer: >60 mL/min (ref >60)
Glucose, Bld: 97 mg/dL (ref 70–99)
Potassium: 3.5 mEq/L (ref 3.5–5.1)
Potassium: 3.6 mEq/L (ref 3.5–5.1)

## 2010-10-15 LAB — COMPREHENSIVE METABOLIC PANEL
ALT: 14 U/L (ref 0–35)
AST: 20 U/L (ref 0–37)
AST: 29 U/L (ref 0–37)
Albumin: 3.7 g/dL (ref 3.5–5.2)
BUN: 10 mg/dL (ref 6–23)
CO2: 26 mEq/L (ref 19–32)
CO2: 23 mEq/L (ref 19–32)
Calcium: 9 mg/dL (ref 8.4–10.5)
Calcium: 9.5 mg/dL (ref 8.4–10.5)
Chloride: 109 mEq/L (ref 96–112)
Chloride: 106 mEq/L (ref 96–112)
Creatinine, Ser: 0.89 mg/dL (ref 0.4–1.2)
Creatinine, Ser: 0.76 mg/dL (ref 0.4–1.2)
GFR calc Af Amer: >60 mL/min (ref >60)
GFR calc Af Amer: >60 mL/min (ref >60)
GFR calc non Af Amer: >60 mL/min (ref >60)
GFR calc non Af Amer: >60 mL/min (ref >60)
Sodium: 139 mEq/L (ref 135–145)
Total Bilirubin: 0.2 mg/dL — ABNORMAL LOW (ref 0.3–1.2)

## 2010-10-15 LAB — URINALYSIS, ROUTINE W REFLEX MICROSCOPIC
Glucose, UA: NEGATIVE mg/dL
Glucose, UA: NEGATIVE mg/dL
Hgb urine dipstick: NEGATIVE
Ketones, ur: NEGATIVE mg/dL
Ketones, ur: NEGATIVE mg/dL
Nitrite: NEGATIVE
Protein, ur: NEGATIVE mg/dL
Protein, ur: NEGATIVE mg/dL
pH: 5.5 (ref 5.0–8.0)
pH: 7 (ref 5.0–8.0)

## 2010-10-15 LAB — TSH
TSH: 4.106 u[IU]/mL (ref 0.350–4.500)
TSH: 3.616 u[IU]/mL (ref 0.350–4.500)

## 2010-10-15 LAB — DIFFERENTIAL
Basophils Absolute: 0.1 10*3/uL (ref 0.0–0.1)
Eosinophils Relative: 3 % (ref 0–5)
Eosinophils Relative: 4 % (ref 0–5)
Lymphocytes Relative: 27 % (ref 12–46)
Lymphocytes Relative: 29 % (ref 12–46)
Lymphs Abs: 2.8 10*3/uL (ref 0.7–4.0)
Monocytes Absolute: 0.6 10*3/uL (ref 0.1–1.0)
Monocytes Relative: 6 % (ref 3–12)
Neutro Abs: 6.6 10*3/uL (ref 1.7–7.7)
Neutrophils Relative %: 60 % (ref 43–77)

## 2010-10-15 LAB — CK TOTAL AND CKMB (NOT AT ARMC)
CK, MB: 0.5 ng/mL (ref 0.3–4.0)
Relative Index: INVALID (ref 0.0–2.5)
Total CK: 21 U/L (ref 7–177)

## 2010-10-15 LAB — POCT CARDIAC MARKERS
CKMB, poc: <1 ng/mL — ABNORMAL LOW (ref 1.0–8.0)
Troponin i, poc: <0.05 ng/mL (ref 0.00–0.09)

## 2010-10-15 LAB — URINE MICROSCOPIC-ADD ON

## 2010-10-15 LAB — CBC
HCT: 37.7 % (ref 36.0–46.0)
HCT: 41.3 % (ref 36.0–46.0)
Hemoglobin: 14.2 g/dL (ref 12.0–15.0)
Platelets: 403 10*3/uL — ABNORMAL HIGH (ref 150–400)
RBC: 4.64 MIL/uL (ref 3.87–5.11)
RDW: 12.7 % (ref 11.5–15.5)

## 2010-10-15 LAB — RPR: RPR Ser Ql: NONREACTIVE

## 2010-10-15 LAB — URINE CULTURE: Colony Count: NO GROWTH

## 2010-10-15 LAB — TROPONIN I: Troponin I: 0.01 ng/mL (ref 0.00–0.06)

## 2010-10-15 LAB — CARDIAC PANEL(CRET KIN+CKTOT+MB+TROPI)
CK, MB: 0.3 ng/mL (ref 0.3–4.0)
CK, MB: 0.5 ng/mL (ref 0.3–4.0)
Relative Index: INVALID (ref 0.0–2.5)
Troponin I: 0.01 ng/mL (ref 0.00–0.06)

## 2010-10-15 LAB — VITAMIN B12: Vitamin B-12: 530 pg/mL (ref 211–911)

## 2010-10-15 LAB — APTT: aPTT: 36 seconds (ref 24–37)
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
## 2010-11-20 NOTE — H&P (Signed)
NAMEMCKENNA, GAMM                 ACCOUNT NO.:  [id number]   MEDICAL RECORD NO.:  [id number]          PATIENT TYPE:  OBV   LOCATION:  5511                         FACILITY:  [facility]   PHYSICIAN:  Della Goo, M.D. DATE OF BIRTH:  04-28-1951   DATE OF ADMISSION:  12/06/2008  DATE OF DISCHARGE:                              HISTORY & PHYSICAL   PRIMARY CARE PHYSICIAN:  [facility]   CHIEF COMPLAINT:  Chest pain.   HISTORY OF PRESENT ILLNESS:  This is a 60 year old female who presents  to the emergency department with complaints of substernal area chest  pain which has been constant for the past 2 days.  She describes the  pain as being in the middle of her chest and goes up to her neck area.  She states it feels as if it is indigestion.  She states the symptoms  were associated with nausea, no vomiting, with shortness of breath and  diaphoresis.  She reports the symptoms worsened over the past 24 hours.  She states she took 2 sublingual nitroglycerin tablets without relief  and went to the emergency department.   The patient was evaluated in the emergency department and administered  many medications including more nitroglycerin, aspirin tablets, Toradol,  however, she did not feel relief until they gave her a GI cocktail.  Prior to that she stated the pain had been as high as a 9/10.   The patient had been seen by cardiology in the past,  Dr. Elsie Lincoln.   PAST MEDICAL HISTORY:  Significant for:  1. Coronary artery disease, previous PTCA with stent placement x2 in      2005.  2. Hypertension.  3. Asthma.  4. Obesity.   PAST SURGICAL HISTORY:  History of a tummy tuck in 1985.   MEDICATIONS:  At this time include:  1. Lisinopril 20 mg one p.o. daily.  2. Ultram 20 mg one p.o. daily.  3. Aspirin 81 mg one p.o. daily.  4. Nitroglycerin sublingual 0.4 mg sublingual q. 5 minutes p.r.n.      chest pain x3.   ALLERGIES:  No known drug allergies.   SOCIAL HISTORY:  The patient  is a nonsmoker, nondrinker.  She denies any  illicit drug usage.   FAMILY HISTORY:  Positive for both parents with coronary artery disease.  Her father had hypertension and diabetes.  She also has a sister with  hypertension and she had a paternal aunt with cancer but she does not  know what type of was.   REVIEW OF SYSTEMS:  Pertinents are mentioned above.   PHYSICAL EXAMINATION:  FINDINGS:  This is a 60 year old obese female in  discomfort but no acute distress.  VITAL SIGNS:  Initially temperature 97.9, blood pressure 175/116, now it  is 152/89; heart rate 97, respirations 22, O2 saturation is 98% on 2  liters nasal cannula oxygen.  HEENT EXAMINATION:  Normocephalic, atraumatic.  There is no scleral  icterus.  Pupils are equally round, reactive to light.  Extraocular  movements are intact.  Funduscopic benign.  Nares are patent  bilaterally.  Oropharynx is clear.  NECK:  Supple, full range of motion.  No thyromegaly, adenopathy,  jugular venous distention.  CARDIOVASCULAR:  Regular rate and rhythm.  No murmurs, gallops or rubs.  LUNGS: Clear to auscultation bilaterally.  ABDOMEN: Positive bowel sounds, soft, nontender, nondistended.  EXTREMITIES: Without cyanosis, clubbing or edema.  NEUROLOGIC EXAMINATION:  The patient is alert and oriented x3 of cranial  nerves are intact.  Motor and sensory function are also intact.   LABORATORY STUDIES:  White blood cell count 10.3, hemoglobin 14.2,  hematocrit 41.3, MCV 88.9, platelets 440, neutrophils 64%, lymphocytes  27%.  Sodium 139, potassium 3.6, chloride 106, carbon dioxide 23, BUN  10, creatinine 0.76, glucose 95, albumin 3.7, AST 29, ALT 14, protime  13.7, INR 1.0 and PTT 36.  Chest x-ray reveals no acute disease process.  Point of care cardiac markers with myoglobin of 48.7, CK-MB less than  1.0, troponin less than 0.05.  EKG reveals normal sinus rhythm at a rate  of 89 and there are no acute ST-segment changes seen.    ASSESSMENT:  A 60 year old female being admitted with:  1. Chest pain.  2. Uncontrolled hypertension.  3. Coronary artery disease.  4. Obesity.   PLAN:  The patient will be admitted to the telemetry area.  Cardiac  enzymes will be performed.  The patient will be placed on nitroglycerin  paste, oxygen, and aspirin therapy.  The patient will also be placed on  DVT and GI prophylaxis and her regular medications will be further  verified.  Further workup will ensue pending results of the patient's  clinical course and her studies.      Della Goo, M.D.  Electronically Signed     HJ/MEDQ  D:  12/07/2008  T:  12/07/2008  Job:  [id number]

## 2010-11-20 NOTE — Consult Note (Signed)
NAMEGRISEL, [name]                 ACCOUNT NO.:  [id number]   MEDICAL RECORD NO.:  [id number]          PATIENT TYPE:  OBV   LOCATION:  5511                         FACILITY:  [facility]   PHYSICIAN:  Francisca December, M.D.  DATE OF BIRTH:  06-Jul-1951   DATE OF CONSULTATION:  DATE OF DISCHARGE:                                 CONSULTATION   CHIEF COMPLAINT:  Chest pain.   HISTORY OF PRESENT ILLNESS:  Ms. Nilsen is a 60 year old female with  known coronary artery disease.  She has previously had a drug-eluting  stent to the LAD in 2005.  She had her last catheterization in December  2006 and her stent was patent at that time.  Nonobstructive disease.  She was now readmitted with chest pain.  She complains of fatigue,  worsening over the past month.  She also had chest pain with exertion.  A short rest alleviates chest pain.  In the emergency room,  nitroglycerin did not help, but a GI cocktail relieved her pain.  She  remains pain free.  Of note, she has been a Cardiology patient of  [facility] in the past but is no longer a  patient there for financial reasons.   PAST MEDICAL HISTORY:  1. CAD.  2. Hypertension.  3. Chronic interstitial cystitis.  4. Anxiety.  5. Depression.  6. GERD.  7. LES  8. Low back pain.  9. Asthma.  10.Obesity.   SOCIAL HISTORY:  She lives in [place].  No tobacco, alcohol, or  illicit drug use.   FAMILY HISTORY:  Mother had heart attack at the age of 23.  Father had  heart attack around at the age of 41.   ALLERGIES:  No known drug allergies.   MEDICATIONS PRIOR TO ADMISSION:  1. Lisinopril 20 mg a day.  2. Ultram p.r.n.  3. Baby aspirin 81 mg a day.  4. Sublingual nitroglycerin p.r.n. chest pain.   CURRENT MEDICATIONS:  1. Coated aspirin 325 mg a day.  2. Lovenox daily.  3. Nitroglycerin 1 inch.  4. Protonix 40 mg a day.  5. Senokot daily.  6. Carafate q.6 h.   REVIEW OF SYSTEMS:  Some sweats, fatigue, shortness  of breath, dyspnea  on exertion, and chest pain.  All other systems are negative.   PHYSICAL EXAMINATION:  VITAL SIGNS:  Temp 98, pulse 70, respirations 16,  blood pressure 127/64, and 95% O2 saturations on room air.  GENERAL:  She is in no acute distress.  HEENT:  Grossly normal.  No carotid or subclavian bruits.  No JVD or  thyromegaly.  Sclerae clear.  Conjunctivae normal.  Nares without  drainage.  CHEST:  Clear to auscultation bilaterally.  No wheezing or rhonchi.  HEART:  Regular rate and rhythm.  No murmur, rub, or ectopy.  ABDOMEN:  Good bowel sounds.  Nontender and nondistended.  No masses, no  bruits.  EXTREMITIES:  No peripheral edema.  SKIN:  Warm and dry.  Cranial nerves II through XII grossly intact.  She  is somewhat anxious.   Chest x-ray, no active  disease.   LABORATORY STUDIES:  Hemoglobin 13, hematocrit 37.7, platelets 403,  white count 9.5.  Sodium 143, potassium 3.6, BUN 9, creatinine 0.90, and  glucose 97.  Point-of-care markers negative x1.  CK-MB and troponin  negative x2.  EKG normal sinus rhythm, within normal limits.   ASSESSMENT AND PLAN:  1. Chest pain, typical.  She says it remains similar to previous chest      pain.  It is interesting that it is only responsive to GI cocktail.      Recommend stress Cardiolite here in the morning.  2. Coronary artery disease.  3. Hypertension.  4. Chronic interstitial cystitis.  5. Anxiety.  6. Depression.  7. Gastroesophageal reflux disease.  8. Restless legs syndrome.  9. Low back pain.  10.Asthma.  11.Obesity.      Guy Franco, P.A.      Francisca December, M.D.  Electronically Signed    LB/MEDQ  D:  12/07/2008  T:  12/08/2008  Job:  [id number]   cc:   Melvern Banker

## 2010-11-20 NOTE — Discharge Summary (Signed)
[name], [name]                 ACCOUNT NO.:  [id number]   MEDICAL RECORD NO.:  [id number]          PATIENT TYPE:  OBV   LOCATION:  5511                         FACILITY:  [facility]   PHYSICIAN:  Isidor Holts, M.D.  DATE OF BIRTH:  1950/12/13   DATE OF ADMISSION:  12/06/2008  DATE OF DISCHARGE:  12/08/2008                               DISCHARGE SUMMARY   PRIMARY MEDICAL DOCTOR:  [facility].   PRIMARY CARDIOLOGIST:  Madaline Savage, M.D.  The patient is now  discharged from Dr. Truett Perna service.   DISCHARGE DIAGNOSES:  1. Chest pain, noncardiac.  2. Probable gastroesophageal reflux disease.  3. Hypertension.  4. History of bronchial asthma.  5. History of coronary artery disease, status post percutaneous      coronary intervention/stent 2005.  6. Morbid obesity.   DISCHARGE MEDICATIONS:  1. Lisinopril 20 mg p.o. daily.  2. Ultram 50 mg p.o. p.r.n. once daily.  3. Aspirin 325 mg p.o. daily (was on 81 mg p.o. daily.)  4. Nitroglycerin 0.4 mg sublingually p.r.n. q. 5 minutes for chest      pain.  5. Protonix 40 mg p.o. daily.   PROCEDURES:  1. Chest x-ray done December 06, 2008.  This showed no active disease.  2. Nuclear medicine myocardial imaging scan, i.e. stress Myoview done      December 08, 2008.  This showed no evidence of ischemia or infarct.      Ejection fraction 64%.   CONSULTATIONS:  Francisca December, M.D., [facility].   ADMISSION HISTORY:  As in H and P notes of December 06, 2008, dictated by Dr.  Della Jefferson.  However, in brief, this is a 60 year old female, with  known history of coronary artery disease status post PTCA with stent x2  2005, hypertension, bronchial asthma, morbid obesity, presenting with a  2 day history of retrosternal constant chest pain associated with  nausea, shortness of breath and diaphoresis, worsening over the prior 24  hours.  This was unresponsive to sublingual Nitroglycerin.  The patient  therefore presented to the emergency  department, where she still did not  respond to more Nitroglycerin, but did respond to GI cocktail.  She was  admitted for further evaluation, investigation and management.   CLINICAL COURSE:  1. Chest pain.  This was atypical in description and did respond to GI      cocktail.  Likely this was secondary to GERD.  The patient      responded to proton pump inhibitor and had no further recurrences      of chest pain, during the course of her hospitalization.   1. Coronary artery disease.  The patient has a known history of      coronary artery disease, and is status post PTCA with stent x2 in      2005.  She had previously been under the care of Dr. Elsie Jefferson.  She      now presents with the above symptoms.  Given her significant risk      factors for coronary artery disease, i.e., known coronary artery  disease, hypertension and morbid obesity, cardiac workup was felt      appropriate.  A 12-lead EKG was unremarkable for acute ischemic      changes.  Cardiac enzymes were cycled and remained unelevated.      Cardiology consultation was kindly provided by Dr. Aubery Jefferson      Cardiologist, who arranged a stress Myoview on December 08, 2008.  This      was negative for reversible or irreversible defects to suggest      ischemia or infarct.  Ejection fraction was normal at 64%.  The      patient has been reassured accordingly.  As she no longer has a      cardiologist, Dr. Amil Jefferson has kindly agreed to have her follow up      with him.   1. Bronchial asthma.  The patient was asymptomatic from this viewpoint      during the course of her hospitalization.   1. Hypertension.  This was controlled with pre-admission ACE      inhibitor.   DISPOSITION:  The patient was on December 08, 2008 asymptomatic, and  considered clinically stable for discharge.  She was therefore  discharged accordingly.   DIET:  Heart healthy.   ACTIVITY:  As tolerated.   FOLLOW UP INSTRUCTIONS:  The patient is to follow  up with her primary MD  at [facility], within 1-2 weeks of discharge.  She has been instructed  to call for an appointment.  In addition, she is to follow up with Dr.  Amil Jefferson, [facility] within one month, on a date to be scheduled.  The patient has been supplied with the appropriate information and  instructed to call for an appointment.  All this has been communicated  to the patient.  She has verbalized understanding.      Isidor Holts, M.D.  Electronically Signed     Isidor Holts, M.D.  Electronically Signed    CO/MEDQ  D:  12/08/2008  T:  12/08/2008  Job:  [id number]   cc:   [facility]  Francisca December, M.D.

## 2010-11-23 NOTE — Discharge Summary (Signed)
[name], SINGLE                           ACCOUNT NO.:  [id number]   MEDICAL RECORD NO.:  [id number]                   PATIENT TYPE:  INP   LOCATION:  2869                                 FACILITY:  [facility]   PHYSICIAN:  Madaline Savage, M.D.             DATE OF BIRTH:  07-28-1950   DATE OF ADMISSION:  08/21/2003  DATE OF DISCHARGE:  08/23/2003                                 DISCHARGE SUMMARY   DISCHARGE DIAGNOSES:  1. Chest pain, etiology undetermined. Status post catheterization this     admission with patent distended segment of the left anterior descending.  2. Known coronary artery disease, status post intervention with stenting of     the left anterior descending on August 16, 2003, by Dr. Allyson Sabal.  3. Hypertension.  4. Depression.  5. Status post hysterectomy.  6. Status post herniorrhaphy.  7. Status post cystopexy.  8. Possible gastroesophageal reflux disease as a source of chest pain, on     empiric proton pump inhibitor therapy.   HISTORY OF PRESENT ILLNESS:  This 60 year old Caucasian lady who was re-  admitted to [facility] five days after her discharge when she  underwent stenting of the LAD and was discharged home on aspirin and Plavix.  The patient was at work,operating the cash register when she began  experiencing chest pain described as a pressure under left breast and  radiating to the mid chest and then up to the neck. It was also associated  with dyspnea and diaphoresis. She took nitroglycerin with relief of chest  pain. The pain appeared again and she presented to the emergency room where  she was assessed by Dr. Effie Shy and admitted on a rule out MI protocol.   The next day she was reassessed by Dr. Domingo Sep where she ordered a 2-D echo  to rule out pericardial effusion as the source of her chest pain. The echo  did not show any pericarditis or any pericardial effusion.   HOSPITAL PROCEDURE:  Cardiac catheterization was performed on August 23, 2003, by Dr. Elsie Lincoln. It showed patent LAD, stent at segment, good left  ventricular systolic function with an EF of 60%, no mitral regurgitation,  and the rest of her coronaries were clean.   The plan was to let the patient go home later on the day of her cath after  her bedrest expires and if she is stable post ambulation, and continue  Plavix and aspirin as an outpatient for the next five months and three  weeks.   DISCHARGE MEDICATIONS:  1. Aspirin 81 mg daily.  2. Plavix 25 mg daily.  3. Paxil 37.5 mg daily.  4. Protonix 40 mg daily.  5. Nitroglycerin 0.4 sublingual p.r.n.  6. She is instructed to resume lisinopril starting tomorrow.   DISCHARGE ACTIVITIES:  No driving, no heavy lifting, no strenuous physical  activity for three days post cath.  DISCHARGE DIET:  Low-fat, low-cholesterol diet.   DISCHARGE FOLLOWUP:  She will have an appointment with Dr. Elsie Lincoln as  previously scheduled after her first hospitalization on September 05, 2003,  at 3:15 and she was encouraged to keep that appointment.      Raymon Mutton, P.A.                    Madaline Savage, M.D.    MK/MEDQ  D:  08/23/2003  T:  08/23/2003  Job:  [id number]   cc:   [facility]

## 2010-11-23 NOTE — Cardiovascular Report (Signed)
[name], [name]                 ACCOUNT NO.:  [id number]   MEDICAL RECORD NO.:  [id number]          PATIENT TYPE:  INP   LOCATION:  4704                         FACILITY:  [facility]   PHYSICIAN:  Armanda Magic, M.D.     DATE OF BIRTH:  11/28/1950   DATE OF PROCEDURE:  06/13/2005  DATE OF DISCHARGE:                              CARDIAC CATHETERIZATION   REFERRING PHYSICIAN:  Mitzi Davenport with [facility].   PROCEDURE:  Left heart catheterization, coronary angiography, left  ventriculography.   OPERATOR:  Armanda Magic, M.D.   INDICATIONS:  Chest pain.   COMPLICATIONS:  None.   IV ACCESS:  Via right femoral artery, 6-French sheath.   IV CONTRAST:  60 cc.   IV MEDICATIONS:  None.   This is a 60 year old white female with a history of coronary disease status  post PTCA stenting of the proximal LAD in February of 2005 with a Cypher  stent. She had recurrent chest pain in May which prompted a repeat cath  which revealed a widely patent LAD stent, but the first septal perforator  and first diagonal were jailed. The vessels were less than 1.5 mm in  diameter and therefore it was felt he could not be intervened on and she has  been on medical management since. She presented with recurrent chest pain  and now presents for cardiac catheterization.   The patient is brought to cardiac catheterization laboratory in the fasting  nonsedated state. Informed consent was obtained. The patient was connected  to continuous heart rate and pulse oximetry monitoring and intermittent  blood pressure monitoring. The right groin was prepped and draped in the  sterile fashion. 1% Xylocaine was used for local anesthesia. Using the  modified Seldinger technique, a 6-French sheath was placed in right femoral  artery. Under fluoroscopic guidance, a 6-French JL-4 catheter was placed in  left coronary artery. Multiple cine films taken at 30 degree RAO and 40  degree LAO views. This  catheter was exchanged out over a guidewire for 6-  [place] JR-4 catheter which was placed under fluoroscopic guidance in the  right coronary artery. Multiple cine films were taken at 30 degree RAO and  40 degree LAO views. This catheter was then exchanged over a guide wire for  a 6-French angled pigtail catheter which was placed under fluoroscopic  guidance in the left ventricular cavity. Left ventriculography was performed  in a 30 degree RAO view and a total of 30 cc of contrast at 15 cc per  second. The catheter was then pulled back across the aortic valve with no  significant gradient noted. At the end of the procedure, all catheters and  sheaths were removed. Manual compression was performed until adequate  hemostasis was obtained. The patient was transferred back to room in stable  condition.   RESULTS:  1.  The left main coronary is widely patent including the proximal LAD      stent. The LAD gives rise to two diagonal branches. The first diagonal      branch has a high-grade stenosis of  80% and appears jailed. There is      also a septal perforator that appears jailed as well. The diagonal one      and a septal perforator both are all less than 1 mm in diameter vessels.      There is a 30% focal eccentric stenosis of the LAD just distal to the      stent at the takeoff of the second diagonal. The second diagonal is      widely patent. The ongoing LAD is widely patent throughout the course      the apex.  2.  The left circumflex is widely patent throughout its course giving rise      to two obtuse marginal branches, one and two which are very small, and      then distally the left circumflex branches in to two further obtuse      marginal three and four branches both of which are widely patent.  3.  The right coronary is widely patent throughout its course and bifurcates      distally in the posterior descending artery and posterior lateral      artery. Posterior lateral artery is  widely patent. The posterior      descending artery has a long eccentric 30-40% narrowing.  4.  Left ventricular systolic function appears normal by left      ventriculography, aortic pressure 117/44 mmHg. LV pressure 101/7 mmHg.   ASSESSMENT:  1.  Nonobstructive coronary disease of the native vessels. There is      obstructive disease of  the first diagonal at the stent site of the left      anterior descending artery and also appears to be a jailed septal      perforator as well but these are not amenable to percutaneous coronary      intervention due to the small caliber of the vessels.  2.  Normal left ventricular function.  3.  Chest pain.   PLAN:  Wean off IV nitroglycerin, continue aspirin. Further treatment per  Dr. Amil Amen. Would also discontinue Lovenox.      Armanda Magic, M.D.  Electronically Signed     TT/MEDQ  D:  06/13/2005  T:  06/14/2005  Job:  [id number]   cc:   Mitzi Davenport, M.D.  [facility]

## 2010-11-23 NOTE — H&P (Signed)
NAME:  [name], [name]                           ACCOUNT NO.:  [id number]   MEDICAL RECORD NO.:  [id number]                   PATIENT TYPE:  INP   LOCATION:  1826                                 FACILITY:  [facility]   PHYSICIAN:  Madaline Savage, M.D.             DATE OF BIRTH:  25-Sep-1950   DATE OF ADMISSION:  08/22/2003  DATE OF DISCHARGE:                                HISTORY & PHYSICAL   HISTORY OF PRESENT ILLNESS:  [name] is a 60 year old white woman who is  readmitted to [facility] 5 days after her discharge.  She underwent  LAD stenting 6 days ago.   The patient, who had no past history of cardiac disease, presented to [facility] on August 15, 2003 with chest pain.  The following day, she  underwent cardiac catheterization.  This demonstrated an LAD with a 70% to  80% hypodense lesion in the junction of the proximal and mid-third,  straddling the first small diagonal branch.  There was a focal 80% lesion  after the second small diagonal branch in the midportion of the LAD.  There  was no other significant disease.  The patient then underwent successful  proximal mid LAD percutaneous coronary intervention using cutting balloon  and stenting using a CYPHER drug-eluting stent.  She was discharged the  following day.   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  This was described as a pressure which began  beneath her left breast, radiated to her mid-chest, then up to her neck, and  then down her left arm.  It was associated with dyspnea, diaphoresis  __________  nausea.  She took one nitroglycerin tablet, which had impact.  There were no other exacerbating or ameliorating factors.  It appeared not  to be related to position, meals, or respirations.  Total duration of chest  pain was approximately 4 hours.  It resolved when she presented to the  emergency department and was given nitroglycerin and  morphine.  Her chest  discomfort is largely resolved at this time.   The patient believes that this chest pain is similar in quality to, but more  intense than, the chest pain which prompted her admission last week.   There is no history of congestive heart failure or arrhythmia.   She has a history of hypertension.  There is a strong family history of  coronary artery disease.  A brother suffered from coronary artery disease in  his 1s and both of her parents suffered from coronary artery disease in  their 57s.  There is no history of diabetes mellitus, smoking, or  hyperlipidemia.   ALLERGIES:  She is not allergic to any medications.   CURRENT MEDICATIONS:  Her current medications include:  1. Aspirin 81 mg p.o. daily.  2. Plavix 75 mg p.o. daily.  3. Lisinopril.  4. Paxil.  5. Nitroglycerin.   PAST SURGICAL HISTORY:  Past surgical history includes:  1. Hysterectomy.  2. Herniorrhaphy.  3. Cystopexy.   SOCIAL HISTORY:  The patient lives alone.  She is divorced.  She works as a  Electronics engineer.   REVIEW OF SYSTEMS:  Review of systems demonstrated no new problems related  to her head, eyes, ears, nose, mouth, throat, lungs, gastrointestinal  system, genitourinary system, or extremities.  There is no history of  neurologic or psychiatric disorder.  There is no history of fever, chills,  or weight loss.   PHYSICAL EXAMINATION:  VITAL SIGNS:  Blood pressure 119/65.  Pulse 59 and  regular.  Respirations 16.  Temperature 97.0.  GENERAL:  The patient was a middle-aged white woman in no discomfort.  She  was alert, oriented, appropriate and responsive.  HEENT:  Head, eyes, nose, and mouth were normal.  NECK:  The neck was without thyromegaly or adenopathy.  Carotid pulses were  palpable bilaterally and without bruits.  CARDIAC:  Examination revealed a normal S1 and S2.  There was no S3, S4,  murmur, rub, or click.  Cardiac rhythm was regular.  No chest wall   tenderness was noted.  LUNGS:  The lungs were clear.  ABDOMEN:  The abdomen was soft and nontender.  There was no mass,  hepatosplenomegaly, bruit, distention, rebound, guarding, or rigidity.  Bowel sounds were normal.  BREAST, PELVIC AND RECTAL:  Examinations were not performed as they were not  pertinent to the reason for acute care hospitalization.  EXTREMITIES:  The extremities were without edema, deviation, or deformity.  Radial and dorsalis pedal pulses were palpable bilaterally.  NEUROLOGIC:  Brief screening neurologic survey was unremarkable.   LABORATORY AND ACCESSORY CLINICAL DATA:  The electrocardiogram was normal.   The chest radiograph, according to the radiologist, demonstrated no evidence  of acute disease.   The initial set of cardiac markers revealed a CK-MB of 1.3, myoglobin 46.6,  and troponin less than 0.05.  Potassium was 3.7, BUN 40, and creatinine  pending.  Remaining studies were pending at the time of this dictation.   IMPRESSION:  1. Recurrent chest pain, 6 days status post left anterior descending stent.  2. Hypertension.  3. Depression.   PLAN:  1. Telemetry.  2. Serial cardiac enzymes.  3. Aspirin.  4. Plavix.  5. Heparin.  6. Intravenous nitroglycerin.  7. Further measures per Dr. Madaline Savage.      [name]. Waldon Reining, MD                   Madaline Savage, M.D.    MSC/MEDQ  D:  08/22/2003  T:  08/22/2003  Job:  [id number]   cc:   Madaline Savage, M.D.  [address]  [place]  [place] [address]  Fax: [phone]

## 2010-11-23 NOTE — Discharge Summary (Signed)
[name], [name]                           ACCOUNT NO.:  [id number]   MEDICAL RECORD NO.:  [id number]                   PATIENT TYPE:  OBV   LOCATION:  6524                                 FACILITY:  [facility]   PHYSICIAN:  Madaline Savage, M.D.             DATE OF BIRTH:  04-15-1951   DATE OF ADMISSION:  08/15/2003  DATE OF DISCHARGE:  08/17/2003                                 DISCHARGE SUMMARY   DISCHARGE DIAGNOSES:  1. Unstable angina pectoris.  2. Coronary artery disease, status post cath with intervention this     admission.  3. Hypertension.  4. Depression.  5. Status post hysterectomy.  6. Status post herniorrhaphy.  7. Status post cystopexy.   HOSPITAL COURSE:  This is a 60 year old Caucasian female who presented to  [facility] emergency room for evaluation of chest pain.  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  She  also experienced some nausea and dyspnea but did not have any diaphoresis.  She arrived to the emergency room by EMS and was given nitroglycerin en  route and she did not have any chest pain on presentation.  Her arrival time  was within two hours after the onset of the pain.  She was admitted to the  telemetry unit under rule out MI protocol.  The next morning, the patient  was assessed by Dr. Elsie Lincoln.  Her enzymes were negative.  Hemoglobin was 12,  hematocrit 36.  She was taken to the cath lab for a cardiac cath the same  day.   HOSPITAL PROCEDURES:  Cardiac catheterization performed by Dr. Allyson Sabal and it  revealed 80% stenosis of the proximal LAD, and the patient had a CYPHER  stent placed with the reduction of the lesion to zero percent.  The second  mid portion of the LAD had stenosis of 80% and a _____________ performed to  that lesion with a reduction of the stenosis to less than 30%.  The patient  tolerated the procedure well  and did not develop any complications post  intervention.   FAMILY HISTORY:  Significant for coronary artery disease in her mother and  brother.   SOCIAL HISTORY:  The patient lives alone.  Divorced.  Works as a Occupational psychologist.   ALLERGIES:  She does not have any known allergies to medications.   MEDICATIONS:  1. Aspirin 81 mg every day.  2. Plavix 75 mg every day.  3. Lisinopril, resume home dose.  4. Paxil, resume home dose.  5. Nitroglycerin 0.4 mg sublingual p.r.n.   DISCHARGE ACTIVITY:  No driving, no lifting greater than 5 pounds, no  strenuous activity for three days.   POST CATH DISCHARGE DIET:  Low fat, low cholesterol diet.   She was allowed  to shower and instructed to call our office with any  problems and phone number was provided.   DISCHARGE FOLLOWUP:  Dr. Elsie Lincoln will see the patient in the office on  September 05, 2003 at 3:15.     Raymon Mutton, P.A.                    Madaline Savage, M.D.    MK/MEDQ  D:  08/17/2003  T:  08/17/2003  Job:  [id number]   cc:   Madaline Savage, M.D.  [address], [address]  [place]  [place] [address]  Fax: [phone]

## 2010-11-23 NOTE — Discharge Summary (Signed)
[name], [name]                           ACCOUNT NO.:  [id number]   MEDICAL RECORD NO.:  [id number]                   PATIENT TYPE:  INP   LOCATION:  6531                                 FACILITY:  [facility]   PHYSICIAN:  Darlin Priestly, M.D.             DATE OF BIRTH:  1951-01-25   DATE OF ADMISSION:  11/22/2003  DATE OF DISCHARGE:  11/23/2003                                 DISCHARGE SUMMARY   [name] is a 60 year old white female with known coronary artery disease.  She is status post stenting in February of 2005.  She presents [facility] the [facility] Room for several reasons, mainly low sternal  epigastric pain.  She had several different types of pain that continued at  low grade despite IV nitroglycerin.  She was tearful.  She has a lot of  stress  at work, alleged depression.  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  Her cardiac  markers were negative x3.  Troponin was less than 0.05.  She was admitted  and it was decided that she should undergo cardiac catheterization.  This  was performed by Dr. Tresa Endo on 11/22/2003. Her LAD stent was patent.  She had  about a 30% stenosis after the stent in her LAD.  She had a 70% of a very  very small diagonal 1.  On 11/23/2003 her blood pressure was 100/48; her  heart rate is 58; respective were 18.  Her sodium 141, potassium 4.9, BUN  21, creatinine 1.0, her glucose was 89.  Her hemoglobin was 7.7; hematocrit  31.0.  She was seen by Dr. Jenne Campus and considered stable for discharge home.   DISCHARGE MEDICATIONS:  1. Enteric coated aspirin 81 mg every day.  2. Imdur 30 mg every day.  3. Protonix 40 mg one pill 2 times a day.  4. Aleve 200 mg one pill 3 times a day for 1 week.  5. Plavix 75 mg once per day.  6. Paxil 12.5 mg once per day.  7. She is to hold her lisinopril.   ADDENDUM:  The patient apparently had diarrhea for a week, constantly, prior  to coming to  the hospital.  Her potassium was 3.0 so she was loaded with  potassium.  It was normalized at the time of her discharge and she states  that she had no further diarrhea.  Her blood pressure has been running low,  thus her lisinopril will be held.  Dr. Tresa Endo did put her on Imdur in case  she was having spasms; we will allow her to have that.  Her other  medications are continued as previously, Protonix __________ in case she had  ordered per Dr. Tresa Endo for possible costochondritis or some antiinflammatory  cause of her chest pain.   She is to do no driving, no lifting of greater than 5 pounds and  no  strenuous activity. She may shower.  She has an appointment to follow up  with Dr. Elsie Lincoln on 12/14/2003.   DISCHARGE DIAGNOSES:  1. Chest pain unknown etiology, no ischemia on her cardiac catheterization.     LAD stent was patent.  2. Low blood pressure, lisinopril on hold.  3. Hypokalemia resolved at the time of discharge.  This was probably     secondary to her diarrhea x1 week.  4. History of CYPHER stent placed February 2005 on Plavix for at least 6     months.     _____________________________________  ___________________________________________  Lezlie Octave, N.P.                      Darlin Priestly, M.D.   BB/MEDQ  D:  11/23/2003  T:  11/24/2003  Job:  [id number]   cc:   Vaughan Browner [place], [place]

## 2010-11-23 NOTE — Cardiovascular Report (Signed)
[name], [name]                           ACCOUNT NO.:  [id number]   MEDICAL RECORD NO.:  [id number]                   PATIENT TYPE:  OBV   LOCATION:  6524                                 FACILITY:  [facility]   PHYSICIAN:  Nanetta Batty, M.D.                DATE OF BIRTH:  11-11-1950   DATE OF PROCEDURE:  08/16/2003  DATE OF DISCHARGE:                              CARDIAC CATHETERIZATION   INDICATIONS:  [name] is a 60 year old moderately overweight white female  without prior cardiac history who was admitted last night for chest pain.  She ruled out for myocardial infarction.  She had no acute  electrocardiographic changes.  She had negative chest CT for dissection or  pulmonary emboli.  She presents now for diagnostic coronary arteriography.   PROCEDURE DESCRIPTION:  The patient was brought to the second floor [facility] in the postabsorptive state.  She was  premedicated with p.o. Valium and IV morphine.  Her right groin was prepped  and shaved in the usual sterile fashion.  1% Xylocaine was used for local  anesthesia.  A 6 French sheath was inserted into the right femoral artery  using standard Seldinger technique. A 6 French right and left Judkins  diagnostic catheter as well as 6 French pigtail catheter were used for  selective coronary angiography, left ventriculography, distal abdominal  aortography.  Omnipaque dye was used for the entirety of the case.  Retrograde aorta, left ventricular pullback pressures were recorded.   HEMODYNAMICS:  1. Aortic systolic pressure 102, systolic pressure 66.  2. Left ventricular systolic pressure 106, end-diastolic pressure 14.  3. There is no pullback gradient noted.   SELECTIVE CORONARY ANGIOGRAPHY:  1. Left main normal.  2. LAD:  The LAD has a 70-80% hypodense lesion in the junction of the     proximal and mid third straddling the first small diagonal branch.  There     was a focal 80% lesion after the  second small diagonal branch in the mid     portion of the LAD.  3. Left circumflex:  Free of significant disease.  4. Right coronary artery:  Large dominant vessel.  Free of significant     disease.   LEFT VENTRICULOGRAPHY:  RAO left ventriculogram was performed using 20 mL of  Omnipaque dye at 10 mL per second.  The overall LVEF is estimated greater  than 60% without focal wall motion abnormalities.   DISTAL ABDOMINAL AORTOGRAPHY:  Distal abdominal aortogram was performed  using 20 mL of Omnipaque dye, 10 mL per second.  The renal arteries were  widely patent.  The infrarenal abdominal aorta and iliac bifurcation  appeared free of significant atherosclerotic changes.   IMPRESSION:  [name] has significant tandem proximal mid LAD lesions which  I suspect are culprit lesions for her chest pain.  Plan will be for PCI  and stenting using  Angiomax, cutting balloon and drug-eluting stent.   PROCEDURE DESCRIPTION:  The existing 6 French sheath in the right femoral  artery was exchanged over wire for a 7 [place] sheath.  A 6 French sheath was  then inserted into the right femoral vein. A 7 [place] JL-4 guide catheter  along with 0.014 190 support guide wire, 2.5/10 cutter were used for initial  PCI.  The patient received Angiomax bolus with an ACT of 330.  She had  received aspirin this morning and got 600 mg of p.o. Plavix prior to  intervention.  Omnipaque dye was used for the entirety of the case.  Retrograde aortic pressure was monitored during the case.  The patient did  receive 200 mcg of intracoronary nitroglycerin.   Initial PCI supported with a 2.5/10 cutter in the mid and proximal lesion.  This provided a suboptimal angiographic result.  This was upgraded to a  2.75/10 cutter at nominal pressures for 40 seconds resulting in reduction of  80% fairly local lesion after the second diagonal branch less than 30%  without dissection.  Following this, a 3.0/13 Cypher drug-eluting stent  was  then deployed under precise angiographic and fluoroscopic control at 13  atmospheres (3.1 mm) resulting in reduction of a proximal 70-80% segmental  stenosis to 0% residual.  The patient tolerated the procedure well.  There  were no hemodynamic or electrocardiographic sequela.   IMPRESSION:  Successful proximal mid LAD PCI using cutting balloon and  stenting using Cypher drug-eluting stent with Angiomax.  The guide wire and  catheter was removed.  The sheaths were sewn securely in place.  The patient  left the lab in stable condition.  Sheaths are to be removed in two hours.  The patient will be treated with aspirin and Plavix and discharged home in  the morning if she remains clinically stable overnight.  She left the lab in  stable condition.                                               Nanetta Batty, M.D.    Cordelia Pen  D:  08/16/2003  T:  08/17/2003  Job:  [id number]   cc:   [facility]

## 2010-11-23 NOTE — H&P (Signed)
[name] NO.:  [id number]   MEDICAL RECORD NO.:  [id number]          PATIENT TYPE:  INP   LOCATION:  1843                         FACILITY:  [facility]   PHYSICIAN:  Francisca December, M.D.  DATE OF BIRTH:  [birth year]   DATE OF ADMISSION:  06/12/2005  DATE OF DISCHARGE:                                HISTORY & PHYSICAL   PRIMARY CARE PHYSICIAN:  Dr. Jaynie Collins [facility].   CHIEF COMPLAINT:  Chest pain.   HISTORY OF PRESENT ILLNESS:  [name] is a 60 year old female patient,  history of single-vessel coronary artery disease, status post CYPHER stent  implantation to the proximal LAD in February 2005.  She had recurrent chest  pain several weeks later, in February 2005, resulting in a coronary  angiography demonstrating patent stent at that time and an additional repeat  cath due to chest pain in May 2005.  At that time again the LAD stent was  patent but she was noted to have stent jailing with the first septal  perforator as well as the first diagonal.  Because the vessels were small in  diameter, less than 1.5-mm, it was felt that these vessels would not be  intervened on.  Again, these procedures were done per Dr. Elsie Lincoln and/or his  associates.  In the interim, the patient has been discharged from their  practice for financial reasons and presents to the ER today with re-  emergence of chest pain which is similar to her initial cardiac presentation  requiring the stent in February 2005.   The patient states that her symptoms this time started about Thursday.  They  initially started as a dull pain located in the mid chest, radiating back  and forth to the left breast, not like her normal indigestion pain.  She has  also noticed some tenderness in the epigastric region, not necessarily  associated with the bouts of chest pain.  Her chest pain has been  intermittent since Thursday.  On Saturday evening she had chest pain and  had  some nausea which was better after she lay down rested and drank some ginger  ale.  She has been using nitroglycerin without any relief of the pain but  later she discovered that these nitroglycerin were old.  She noticed that a  more severe pain re-emerged last night around 11 and this pain really had  not resolved.  She has not had any other associated with the symptoms such  as nausea, diaphoresis, shortness of breath, or exertional chest pain, other  than the nausea she experienced on Saturday night.  Again, today she  attempted the sublingual nitroglycerin and when the pain was not resolved,  she called EMS.  EMS was who called her attention to the fact that the  sublingual nitroglycerin were old.  En route to the hospital, she was given  four baby aspirin and two sublingual nitroglycerin without relief of her  pain.  In the ER, she was started on oxygen, given Zofran, and IV morphine  with relief of her  pain.  She still has a dull component to the pain but  nothing like the discomfort she has been experiencing.  Important to note  that her rest pain is more dull in quality, while her exertional pain is  more sharp in quality and this is consistent with her prior cardiac  presentation in February 2005.  In discussing other potential cardiac  symptoms, again she has not had any orthopnea, shortness of breath, dyspnea  on exertion, chest pain with exertion.  She has had a sensation of heart  pounding or palpitations intermittently over the past few weeks.  These  symptoms usually only last for one to two minutes.   REVIEW OF SYSTEMS:  She has had no upper respiratory infection symptoms such  as cough, fever, chills, or myalgias.  She did report diarrhea, Friday,  Saturday, and Sunday and initially she was suspicious that her chest pain  syndrome may be related to this.  In regards to her activity levels, again  no exertional symptoms.  She states she purposely walks slowly.  She  has had  no lower extremity swelling.  No hematuria.  No dysuria.  Although the  patient does have a history of chronic interstitial cystitis and manages  that pain with p.r.n. Ultram.  She has had no dark or bloody stools.   FAMILY MEDICAL HISTORY:  Her mother died of an MI.  Her mother also had  hypertension.  Her father died at age 84 with a massive MI.  He had  hypertension and diabetes.  She has several sisters without any health  problems.  She has a brother with CAD and drug problems.   SOCIAL HISTORY:  The patient is currently unemployed.  She reports in the  past year and a half being fired from two jobs.  The second job she was  fired from due to increased absences from her cardiac illness.  She states  she has worked with the [employer] attempting to obtain  unemployment benefits, feeling that she was not rightfully let go from her  job, but she has been unsuccessful in obtaining any financial assistance  from [employer].  She has been out of work for eight months and her  church is helping her with her Jefferson.  She is divorced.  She lives alone.  She has a son who is 62 years old and apparently he and his wife are  expecting their first grandchild and the patient is quite excited over this.  She does not smoke or use alcohol.   PAST MEDICAL HISTORY:  1.  CAD, status post CYPHER stent implantation to the LAD in February 2005.  2.  Cardiac catheterization, May 2005, revealing a patent LAD stent but with      stent jail to the first septal perforator as well as to the first      diagonal.  Both of these vessels are very small diameter, 1.5-mm and      were not intervened upon.  3.  Hypertension .  4.  GERD.  5.  Anxiety and depression.  6.  Interstitial cystitis.  7.  Back problems.  8.  Restless leg syndrome.   PRIOR SURGICAL HISTORY:  1.  Hysterectomy.  2.  Herniorrhaphy.  3.  Cystopexy.  ALLERGIES:  No drug allergies.  The patient does have  significant food  allergies.  She is unable to have corn, wheat, white flour products, or  artificial sugar.   CURRENT MEDICATIONS:  1.  Aspirin  81 mg daily.  2.  Prevacid 30 mg daily.  3.  Lisinopril/HCTZ 20/25 daily.  4.  Hydroxyzine 25 mg q.6h. p.r.n. for itching.  5.  Tramadol 50 mg b.i.d. p.r.n.   PHYSICAL EXAMINATION:  GENERAL:  This is a pleasant but anxious and  depressed female patient complaining of recurrent chest pain and becomes  quite tearful when discussing her financial situation.  VITAL SIGNS:  Temp 97.4, BP 125/64, pulse 57 and regular, respirations 20.  HEENT:  Head is normocephalic.  Sclerae are not injected.  NECK:  Supple without cervical adenopathy.  NEURO:  The patient is alert and oriented x4, moving all extremities x4, no  focal neurological deficits.  CHEST:  Bilateral lung sounds are clear to auscultation.  Respiratory effort  is not labored.  She is currently on nasal cannula O2 sating at 97% here in  the ER.  It is noted that her anterior chest at the lower sternal border  over the epigastrium is somewhat tender with palpation.  She is not tender  when palpating over the anterior chest wall on the left or right breast  region.  HEART:  Sounds are S1 S2.  No rubs, murmurs, thrills, no gallops, no JVD.  Carotids 2+ bilaterally with no bruits.  Apical __________  pulse is  regular.  She __________  sinus rhythm on the bedside telemetry monitor.  ABDOMEN:  Soft, nontender, nondistended without hepatosplenomegaly, masses,  or bruits.  EXTREMITIES:  Symmetrical in appearance without edema, cyanosis, clubbing.   LABORATORY:  Point-of-care enzymes have been negative x3 collections.  A  urinalysis is negative.  Sodium is 137, potassium is 3.2, BUN 14, creatinine  0.9, white count is mildly elevated at 11.5, hemoglobin 13, platelets  444,000.   DIAGNOSTIC STUDIES:  An  EKG shows sinus rhythm, nonspecific ST segment  changes.  No acute ischemia.  Chest x-ray  is negative for any acute  processes.   IMPRESSION:  1.  Unstable angina in a patient with known coronary artery disease and      prior left anterior descending artery stent in February 2005.  2.  A 90% jailed diagonal branch off the left anterior descending artery,      per catheterization in May 2005.  3.  Suspect a component of costochondritis.  4.  Hypertension, currently controlled.  5.  Hypokalemia in a patient on thiazide diuretic.  6.  Chronic interstitial cystitis.  7.  Anxiety and depression.  8.  Gastroesophageal reflux disease.  9.  Restless leg syndrome.  10. Mechanical low back pain.   PLAN:  1.  Toradol IV for costochondritis portion, change to p.o. if tolerates.  2.  Continue PPI for GERD symptoms.  3.  Morphine sulfate for cardiac as well as other pain.  4.  Start Lovenox 1 mg/kg subcu q.12h. pharmacy to dose and IV nitroglycerin      for persistent chest discomfort which is suspected to be related to     angina.  5.  Cycle the cardiac isoenzymes q.8h. x3 collections.  6.  Telemetry monitoring.  Since she will be on IV nitro and is having      recurrent pain, we have requested a stepdown bed for better management      of titration of IV nitroglycerin.  7.  Oral repletion of potassium.  Hydrochlorothiazide placed on hold pre      cardiac catheterization.  8.  Plan cardiac catheterization, June 13, 2005, in the afternoon.  Currently there are no spots available in the cath lab.  She is      currently on the add-on board.  She will be allowed a clear liquid      breakfast.  Cath will be rescheduled if unable to proceed due to no      openings on June 13, 2005.      Allison L. Rolene Course      Francisca December, M.D.  Electronically Signed    ALE/MEDQ  D:  06/12/2005  T:  06/12/2005  Job:  [id number]   cc:   Andris Baumann, Dr.  [facility]

## 2010-11-23 NOTE — H&P (Signed)
[name], [name]                 ACCOUNT NO.:  [id number]   MEDICAL RECORD NO.:  [id number]          PATIENT TYPE:  EMS   LOCATION:  MAJO                         FACILITY:  [facility]   PHYSICIAN:  Thomas C. Wall, M.D.   DATE OF BIRTH:  [birth year]   DATE OF ADMISSION:  09/03/2004  DATE OF DISCHARGE:                                HISTORY & PHYSICAL   HISTORY OF PRESENT ILLNESS:  [name] is a 60 year old white female who  presents to [facility] Emergency Room complaining of chest discomfort.  She  describes a several-week history of left breast sharp pains that radiate  under her breast, to her chest and back and into her shoulder blades.  These  would usually last 2 to 3 hours.  She felt they were worse with over  exertion such as bending over.  She felt it was like a catch, and she could  not straighten up, listing, and they would be better with rest.  She states  it would take her breath away, but she was not short of breath nor did she  have diaphoresis, and she has had some problems with nausea.  She would rate  this a 5 to 9 on scale of 0 to 10 for these episodes.  Over the weekend, she  feels it has become much worse in intensity and states that each episode is  around a 9.  Saturday, she actually took 3 sublingual nitroglycerin and 2  baby aspirin without relief.  She was finally able to rest, and it  eventually went away.  Duration was about 1 hours.  While at work today at 3  o'clock, she suddenly developed sweating from her naval up associated with  the above symptoms. She became very frightened and thought about calling an  ambulance but instead drove to the hospital.  She notes that she has been  turned away from Dr.  Truett Perna practice secondary to lack of insurance.   PAST MEDICAL HISTORY:   ALLERGIES:  No known drug allergies.  However, she has multiple food  allergies and has been placed on a strict diet by Dr. Lenon Ahmadi.  States she  cannot have wheat, corn, artificial  sugar, white flour products.   MEDICATIONS:  1.  Plavix 75 mg daily.  2.  Baby aspirin 2 tablets daily.  3.  Paxil, unknown dosage, daily.  4.  Lisinopril, unknown dosage, daily.  5.  Requip, unknown dose, daily.  6.  Naprosyn, unknown dose, daily.  7.  Nitroglycerin 0.4 p.r.n.  8.  Flonase, unknown dose, daily.  9.  Zyrtec, unknown dose, daily.   PAST MEDICAL HISTORY:  1.  Probable depression,  2.  Anxiety.  3.  Hypertension.  4.  GERD.  5.  Back problems.  6.  Interstitial cystitis.  7.  Coronary artery disease for which she received a Cypher stent to the LAD      on February 5, reducing this from 80% to 0%.  It is noted that a jailed      septal perforator was involved.  Last catheterization was on  Nov 22, 2003.  EF was normal.  Mild mitral valve prolapse, 70% diagonal #1, 30%      diagonal #2, patent stent to proximal LAD with a jailed septal      perforator.  Her last echocardiogram August 22, 2003, showed EF 55 to      65% without wall motion abnormalities, without mitral valve prolapse,      and mild right ventricular enlargement.  8.  History of restless leg syndrome.  9.  Hysterectomy.  10. Herniorrhaphy.  11. Cystopexy.   SOCIAL HISTORY:  She resides in [place] alone.  She is employed as  an Print production planner for a [employer].  She is divorced.  She has  one son, age 16, who resides in [place].  No grandchildren. She denies any  tobacco, alcohol, drug, or herbal medication use.  Diet as previously  described, and she denies exercise.   FAMILY HISTORY:  Notable for death of her mother with a myocardial  infarction and a history of hypertension.  Her father died at 65 with a  massive myocardial infarction, history of hypertension, and diabetes.  Three  sisters are alive and well.  One brother, age 70, has heart problems and  also a drug problem.   REVIEW OF SYSTEMS:  Notable for a 10-pound weight loss in the last month,  her new diet.   Glasses.  She is losing teeth and needs multiple teeth  pulled.  Chronic sinus problems, dry skin, chronic two-pillow orthopnea,  chronic edema, palpitations and dizziness when standing but without syncope.  Postmenopausal.  Depression, anxiety, nausea, GERD.   PHYSICAL EXAMINATION:  VITAL SIGNS:  Temperature 99.1, blood pressure  106/72, pulse 77 and regular, respirations 18, 95% saturation on room air.  GENERAL:  Well-nourished, well-developed, pleasant, white female in no  apparent distress.  HEENT:  Unremarkable.  NECK:  Supple without thyromegaly, adenopathy, JVD, or carotid bruits.  HEART:  Regular rate and rhythm without murmurs, rubs, clicks, or gallops.  LUNGS:  Sounds are clear to auscultation anterolaterally.  SKIN:  Integument was intact.  ABDOMEN:  Unremarkable.  EXTREMITIES:  Unremarkable.   LABORATORY DATA AND OTHER STUDIES:  EKG:  Normal sinus rhythm, normal axis,  normal intervals, early R wave.  There is no change from February 2005.   Other labs are pending at the time of this dictation.   IMPRESSION:  Atypical chest discomfort with initial electrocardiogram  unremarkable.  History as previously.   PLAN:  Dr. Daleen Squibb spoke with and reviewed the patient's history, and examined  the patient.  He felt that her symptoms are probably not ischemia.  However,  her symptoms did disappear after initiation of IV nitroglycerin.  We will  admit her to [facility] to rule out myocardial infarction.  If her  enzymes are negative, anticipate  discharge in the morning with possible outpatient Cardiolite.  If her blood  work is abnormal, she should undergo cardiac catheterization.  We will check  lipids as well as her remaining blood work.  We will discontinue Plavix  since it has been over one year post Cypher stenting.  We will also have  case management assist discharge needs.     EW/MEDQ  D:  09/03/2004  T:  09/03/2004  Job:  [id number]   cc:   Darnelle Going, [facility]   Jamison Neighbor, M.D.  [address]  [place]  [place]  [address]  Fax: [phone]

## 2010-11-23 NOTE — Cardiovascular Report (Signed)
NAME:  [name], [name]                           ACCOUNT NO.:  [id number]   MEDICAL RECORD NO.:  [id number]                   PATIENT TYPE:  INP   LOCATION:  6531                                 FACILITY:  [facility]   PHYSICIAN:  Nicki Guadalajara, M.D.                  DATE OF BIRTH:  1951-03-03   DATE OF PROCEDURE:  11/22/2003  DATE OF DISCHARGE:                              CARDIAC CATHETERIZATION   INDICATIONS:  Ms. Sarenity Ramaker is a 60 year old white female who has known  coronary artery disease.  She is status post stenting by Dr. Allyson Sabal on  August 16, 2003 to her LAD at which time she had a 3.0 x 13-mm Cypher stent  inserted to the LAD.  There was 30% lesion in the LAD beyond the diagonal  vessel.  The first diagonal vessel was jailed, but this was small caliber.  The patient developed recurrent chest pain one week later.  Repeat  catheterization by Dr. Elsie Lincoln did not show any restenosis although the first  diagonal vessel again was small caliber and appeared similar to the way it  was prior to stenting.  The patient had been doing fairly well on medical  therapy.  She has been under increased stress that is somewhat work related.  She also has notice some chest pain with definite chest wall tenderness, but  presented to the emergency room with chest pain that seemed nitrate  responsive.  She was seen by Dr. Elsie Lincoln who advised repeat cardiac  catheterization.   DESCRIPTION OF PROCEDURE:  After premedication with Versed 2 mg  intravenously, the patient was prepped and draped in the usual fashion.  Her  right femoral artery was punctured anteriorly and a 6 French sheath was  inserted.  Diagnostic catheterization was done with 6 French Judkins 4 left  and right coronary catheters as well as a right No-Torque catheter.  A  pigtail catheter was used for biplane cine left ventriculography.  The  patient tolerated the procedure well.  Hemostasis was obtained by direct  manual pressure.   HEMODYNAMIC DATA:  Central aortic pressure was 121/63.  Left ventricular  pressure was 121/8.  Post A wave 13.   ANGIOGRAPHIC DATA:  The left main coronary artery was angiographically  normal and bifurcated into an LAD and left circumflex system.   The LAD had a stent in its proximal segment which jailed the first septal  perforator and first diagonal vessel.  The first diagonal vessel was less  than 1.5 mm in size and there was no change in the 70% ostial stenosis in  this very small vessel.   The second diagonal vessel seemed to arise just after the stented segment.  There was a 30% narrowing in the  LAD in the region of the second diagonal  take off which was not significantly changed.   The circumflex vessel was angiographically normal and gave rise  to two  marginal vessels.   The right coronary artery was a large caliber dominant angiographically  normal vessel that gave rise to the PDA and posterior lateral system.   Biplane left cine left ventriculography revealed normal LV contractility.  There were no focal segmental wall motion abnormalities.  There was  suggestion of mild angiographic mitral valve prolapse.   IMPRESSION:  1. Normal left ventricular function.  2. Mild angiographic mitral valve prolapse.  3. Widely patent stent in the left anterior descending with no change in the     previous first diagonal vessel which is jailed by the stent, but is a     small caliber 1.5 mm vessel with 70-80% ostial stenosis and evidence for     30% stenosis in the left anterior descending beyond the stented segment     in the region of the second diagonal take off.   RECOMMENDATIONS:  Medical therapy.                                               Nicki Guadalajara, M.D.    TK/MEDQ  D:  11/22/2003  T:  11/23/2003  Job:  [id number]   cc:   Madaline Savage, M.D.  [address]  [place]  [place] [address]  Fax: [phone]   Thereasa Solo. Little, M.D.  [address]  [address]   [place]  [place] [address]  Fax: [phone]

## 2010-11-23 NOTE — H&P (Signed)
NAME:  [name], [name]                           ACCOUNT NO.:  [id number]   MEDICAL RECORD NO.:  [id number]                   PATIENT TYPE:  OBV   LOCATION:  1824                                 FACILITY:  [facility]   PHYSICIAN:  [name]. Waldon Reining, MD             DATE OF BIRTH:  [birth year]   DATE OF ADMISSION:  08/15/2003  DATE OF DISCHARGE:                                HISTORY & PHYSICAL   REASON FOR ADMISSION:  [name] is a 60 year old white female who is  admitted to [facility] for further evaluation of chest  pain.   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  The discomfort was  described as a sharp and a pressure sensation located in focal region in the  left inframammary area.  It radiated to the center of her chest and then to  the left neck.  It was associated nausea and dyspnea but no diaphoresis.  It  was exacerbated by deep inspiration.  There were no other exacerbating or  ameliorating factors. It appeared not to be related to position or meals.  The discomfort was eventually relieved when EMS arrived and administered one  nitroglycerin tablet.  Her chest discomfort has resolved at this time. The  total duration of chest discomfort is approximately two to two and a half  hours.   As noted, the patient has no past history of cardiac disease including no  history of chest pain, coronary artery disease, congestive heart failure, or  arrhythmia.  She has a history of hypertension and is on medication. There  is also a strong family history of coronary artery disease.  A brother  suffered from coronary artery disease in his 20s and both of her parents  suffered from coronary artery disease in their 45s.  There is no history of  diabetes mellitus, smoking, or hyperlipidemia.   In addition to the  hypertension noted above, the patient has a history of  depression.   ALLERGIES:  She is not allergic to any medications.   CURRENT MEDICATIONS:  Lisinopril and Paxil.  She also takes diazepam and  Celebrex on a p.r.n. basis.   PAST SURGICAL HISTORY:  1. Hysterectomy.  2. Herniorrhaphy.  3. Bladder tacking.   PAST MEDICAL HISTORY:  Significant injuries none.   FAMILY HISTORY:  Notable for coronary artery disease in her brother, mother,  and father as noted above.   SOCIAL HISTORY:  The patient lives alone.  She is divorced.  She works as a  Conservation officer, [employer] in [place].   REVIEW OF SYMPTOMS:  Reveals no problems related to her head, eyes, ears,  nose, mouth, throat, lungs, gastrointestinal, genitourinary, or extremities.  There is no history of neurologic or psychiatric disorder.  There is  no  history of fever, chills, or weight loss.   PHYSICAL EXAMINATION:  VITAL SIGNS:  Blood pressure 108/66, pulse 63 and  regular, respirations 22, temperature 97.3.  GENERAL:  The patient is an obese, middle-aged white woman in no discomfort.  She is alert, oriented, appropriate, and responsive.  HEENT:  Normal.  NECK:  Without thyromegaly or adenopathy.  Carotid pulses were palpable  bilaterally without bruits.  CARDIOVASCULAR:  Normal S1 and S2. There was no S3, S4, murmur, rub, or  click.  Cardiac rhythm was regular.  CHEST:  Palpation of the left parasternal region as well as the left  inframammary region reproduced the patient's chest pain exactly.  LUNGS:  Clear.  ABDOMEN:  Soft and nontender.  There was no mass, hepatosplenomegaly, bruit,  distention, rebound, guarding, or rigidity.  Bowel sounds were normal.  RECTAL:  Not performed as they were not pertinent to the reason for acute  care hospitalization.  GENITAL:  Not performed as they were not pertinent to the reason for acute  care hospitalization.  BREASTS:  Not performed as they were not pertinent to the reason for acute   care hospitalization.  EXTREMITIES:  Without edema, deviation, or deformity.  Radial and dorsalis  pedis pulses were palpable bilaterally.  NEUROLOGICAL:  Brief screening neurologic survey was unremarkable.   LABORATORY DATA:  The electrocardiogram was normal.  The chest radiograph  report was pending at the time of this dictation.  The initial set of  cardiac markers revealed the myoglobin of 67.7, CK-MB less than 1.0, and  troponin less than 0.05. The remaining studies were pending at the time of  this dictation.   IMPRESSION:  1. Atypical chest pain:  The discomfort is a mostly sharp, low with a     pressure component, discomfort in the left inframammary region.  It is     reproduced by palpation.  2. Hypertension.  3. Depression.   PLAN:  1. Telemetry.  2. Serial cardiac enzymes.  3. Aspirin.  4. Lovenox.  5. Nitroglycerin paste.  6. Fasting lipid profile.  7. Chest and abdominal CT scans to exclude pulmonary embolus and aortic     dissection.  8. Further measures per Dr. Gaspar Garbe B. Little.   DISPOSITION:  This patient's encounter was chaperoned by Samule Ohm.                                                [name]. Waldon Reining, MD    MSC/MEDQ  D:  08/16/2003  T:  08/16/2003  Job:  [id number]   cc:   Thereasa Solo. Little, M.D.  [address]  [address]  [place]  [place] [address]  Fax: [phone]

## 2010-11-23 NOTE — Discharge Summary (Signed)
[name], [name]                 ACCOUNT NO.:  [id number]   MEDICAL RECORD NO.:  [id number]          Jefferson TYPE:  INP   LOCATION:  4704                         FACILITY:  [facility]   PHYSICIAN:  Francisca December, M.D.  DATE OF BIRTH:  05/12/1951   DATE OF ADMISSION:  06/12/2005  DATE OF DISCHARGE:  06/14/2005                                 DISCHARGE SUMMARY   REASON FOR ADMISSION:  [name] is a 60 year old Jefferson with a history of  single vessel coronary artery disease status post Cypher stent in February  2005.  She has been hospitalized for chest discomfort several times since  then during that first year and has had repeat angiographies done in May  2005.  She presented with chest pain, Victoria LAD stent was patent but she was  noted to have stent jail at Victoria first septal perforator as well as Victoria first  diagonal but due to Victoria small diameter size of these vessels, no  intervention was opted for.  Because of [name]'s social status in  regards to being unemployed, she had been unable to pay her bills and  subsequently was discharged from Dr. Reino Kent' practice for financial  reasons.  She presented to Victoria ER on Victoria day of admission with recurrent  chest discomfort, somewhat atypical based on history, but typical for this  Jefferson according to her prior history.  Victoria pain was located in Victoria mid  chest radiating back and forth to Victoria left breast, a dull pain at times  associated with epigastric tenderness, intermittent associated with some  nausea and relieved but not resolved with nitroglycerin.  Please refer to  Victoria H&P for details.   In Victoria ER, [name] was still having some chest discomfort.  Her vital  signs were stable.  Her exam was, otherwise, unremarkable including not  tender with palpation over Victoria anterior chest wall, right breast region, but  she was a little tender with palpation of Victoria epigastric region.  Her  initial point of care enzymes were negative x 3.  Lab  work was, otherwise,  negative except for a low potassium of 3.2.  EKG was not ischemic with  nonspecific ST changes, not really changed from prior EKGs.   ADMISSION DIAGNOSIS:  1.  Unstable angina in a Jefferson with prior left anterior descending  stent      in 2005.  2.  90% jailed diagonal branch off Victoria left anterior descending  per      catheterization in 2005.  3.  Suspected costochondritis as component.  4.  Hypertension.  5.  Hypokalemia in a Jefferson on Dyazide diuretic.  6.  History of chronic interstitial cystitis.  7.  Anxiety and depression.  8.  Gastroesophageal reflux disease.  9.  Restless leg syndrome.  10. Mechanical low back pain.   HOSPITAL COURSE:  [name] was admitted to Victoria telemetry unit where she  was cycled on cardiac enzymes, these were negative.  Because of Victoria  suspected musculoskeletal component, Toradol IV was ordered.  She was  continued on PPIs for GERD  symptoms.  She was started on Lovenox 1 mg per kg  subcu and IV nitroglycerin for possible ischemic symptoms.  Her potassium  was low and since she was planned to undergo cardiac catheterization within  Victoria next 24 hours, hydrochlorothiazide was placed on hold.  On June 13, 2005, [name] had no additional chest pain.  She was taken to Victoria cath  lab that same day by Dr. Mayford Knife where angiography was essentially unchanged  from 2005 angiography, please refer to Dr. Norris Cross notes.  She did have a  30-40% area on Victoria RCA and obstructive disease of Victoria diagonal one at stent  but not amenable to PCI.  Her LV function was normal.  Subsequently, her  Lovenox was discontinued and she was watched overnight.  Victoria following  morning, [name]'s vital signs were stable.  Her exam was unremarkable.  Her right groin was intact without bruising.  At this time, we were  uncertain what was causing her pain.  Victoria plans  were to discharge her home  and have her follow up with Dr. Randa Lynn in [facility].  Victoria  research nurses  have talked with [name] and there is a possibility she will be  enrolling in Victoria Improve It Study.  If she does not qualify, recommend  beginning Simvastatin 40 mg per day, otherwise, resume previous medications.   DISCHARGE DIAGNOSIS:  1.  Chest pain, questionable unstable angina.  2.  Repeat catheterization on June 13, 2006, showing no change from prior      cath in 2005, normal left ventricular systolic function.  3.  Possible costochondritis as contributing factor.  4.  Dyslipidemia.  5.  Poor financial situation.   DISCHARGE MEDICATIONS:  1.  Aspirin 81 mg daily.  2.  Prevacid 30 mg daily.  3.  Lisinopril HCT 20/25 once daily.  4.  Hydroxyzine 25 mg every 6 hours as needed for anxiety.  5.  Travenol 50 mg b.i.d. p.r.n. pain.  6.  Simvastatin 40 mg once daily if not enrolled in Victoria previously mentioned      study.   DISCHARGE INSTRUCTIONS:  Diet low fat, low cholesterol.  Activity:  Increase  activity slowly, no lifting greater than 10 pounds for Victoria next two days,  shower only next two days.  She needs to follow up with Dr. Randa Lynn as per her  next appointment.  Follow up with Dr. Amil Amen as needed.  [name] has  also been given information for [facility] in case she wishes to relocate  her primary health care from [facility] to [place].      [name] Course      Francisca December, M.D.  Electronically Signed    ALE/MEDQ  D:  07/26/2005  T:  07/26/2005  Job:  [id number]   cc:   Dr. Randa Lynn

## 2010-11-23 NOTE — Cardiovascular Report (Signed)
[name], NORTHRUP                           ACCOUNT NO.:  [id number]   MEDICAL RECORD NO.:  [id number]                   PATIENT TYPE:  INP   LOCATION:  2908                                 FACILITY:  [facility]   PHYSICIAN:  Madaline Savage, M.D.             DATE OF BIRTH:  May 22, 1951   DATE OF PROCEDURE:  08/23/2003  DATE OF DISCHARGE:                              CARDIAC CATHETERIZATION   PROCEDURES PERFORMED:  1. Selective coronary angiography.  2. Retrograde left heart catheterization.  3. Left ventricular angiography.   COMPLICATIONS:  None.   ENTRY SITE:  Right femoral.   DYE USED:  Omnipaque.   PATIENT PROFILE:  The patient is a 60 year old white married female patient  of Dr. Caprice Kluver who was hospitalized on August 22, 2003 with chest pain  similar to chest pain she had had a week ago.  Cardiac enzymes have been  negative and EKGs failed to show any ischemic change.   Given the fact that the patient underwent LAD stenting by Dr. Allyson Sabal one week  ago, it was felt that the patient should be recatheterized to evaluate her  chest pain in case she had in-stent restenosis or jailing of a side branch.  This procedure was performed on an inpatient basis electively without  complications.   RESULTS:   PRESSURES:  Left ventricular pressure was 120/8, end-diastolic pressure 13.  Central aortic pressure 120/65, mean of 90.  No aortic valve gradient by  pullback technique.   ANGIOGRAPHIC RESULTS:  1. The left main coronary artery was patent and normal.  2. The LAD contains some trivial luminal irregularities just after the     ostium.  No more than 20% narrowed and then we see a patent LAD stent     extending from just before the septal perforator branch to just after the     second perforator branch.  There was a step up and a step down into and     out of the stent.  It is widely patent.  There is brisk TIMI-3 flow     throughout the vessel.  3. The diagonal branch #1  arising from the mid portion of the stented LAD is     tiny and thread-like and was that way before the stent was placed.  The     same applies to the second diagonal branch which is also thread-like and     unchanged.  4. The circumflex is nondominant.  It gives rise to one bifurcating obtuse     marginal branch and all sites in the circumflex system appear patent.  5. The right coronary artery is widely patent throughout.  No significant     lesions are seen.   The left ventricular angiogram shows brisk contractility of all wall  segments.  No segmental wall motion abnormality seen.  No mitral  regurgitation.  No LV thrombus.  The aortic root  also looks normal.   FINAL IMPRESSION:  1. Angiographically patent coronary arteries.  2. Widely patent mid left anterior descending stent.  3. Normal left ventricular systolic function.   PLAN:  The patient's chest pain could possibly be related to spasm or  alternatively to a gastrointestinal etiology.  Histamine 2 blockers or  proton pump inhibitors will be prescribed at discharge and the patient will  follow up with Dr. Caprice Kluver.                                               Madaline Savage, M.D.    WHG/MEDQ  D:  08/23/2003  T:  08/23/2003  Job:  [id number]

## 2010-12-02 ENCOUNTER — Emergency Department (HOSPITAL_COMMUNITY)
Admission: EM | Admit: 2010-12-02 | Discharge: 2010-12-02 | Disposition: A | Discharge status: Home / Self Care | Payer: Medicaid Other | Attending: Emergency Medicine | Admitting: Emergency Medicine

## 2010-12-02 DIAGNOSIS — I252 Old myocardial infarction: Secondary | ICD-10-CM | POA: Insufficient documentation

## 2010-12-02 DIAGNOSIS — I10 Essential (primary) hypertension: Secondary | ICD-10-CM | POA: Insufficient documentation

## 2010-12-02 DIAGNOSIS — M5430 Sciatica, unspecified side: Secondary | ICD-10-CM | POA: Insufficient documentation

## 2010-12-02 DIAGNOSIS — N3010 Interstitial cystitis (chronic) without hematuria: Secondary | ICD-10-CM | POA: Insufficient documentation

## 2011-03-07 ENCOUNTER — Emergency Department (HOSPITAL_COMMUNITY): Payer: Medicare Other

## 2011-03-07 ENCOUNTER — Observation Stay (HOSPITAL_COMMUNITY)
Admission: EM | Admit: 2011-03-07 | Discharge: 2011-03-08 | Disposition: A | Discharge status: Home / Self Care | Payer: Medicare Other | Attending: Cardiovascular Disease | Admitting: Cardiovascular Disease

## 2011-03-07 DIAGNOSIS — G8929 Other chronic pain: Secondary | ICD-10-CM | POA: Insufficient documentation

## 2011-03-07 DIAGNOSIS — I498 Other specified cardiac arrhythmias: Secondary | ICD-10-CM | POA: Insufficient documentation

## 2011-03-07 DIAGNOSIS — I2510 Atherosclerotic heart disease of native coronary artery without angina pectoris: Secondary | ICD-10-CM | POA: Insufficient documentation

## 2011-03-07 DIAGNOSIS — G589 Mononeuropathy, unspecified: Secondary | ICD-10-CM | POA: Insufficient documentation

## 2011-03-07 DIAGNOSIS — IMO0001 Reserved for inherently not codable concepts without codable children: Secondary | ICD-10-CM | POA: Insufficient documentation

## 2011-03-07 DIAGNOSIS — K219 Gastro-esophageal reflux disease without esophagitis: Principal | ICD-10-CM | POA: Insufficient documentation

## 2011-03-07 DIAGNOSIS — Z9861 Coronary angioplasty status: Secondary | ICD-10-CM | POA: Insufficient documentation

## 2011-03-07 DIAGNOSIS — E8881 Metabolic syndrome: Secondary | ICD-10-CM | POA: Insufficient documentation

## 2011-03-07 DIAGNOSIS — R079 Chest pain, unspecified: Secondary | ICD-10-CM | POA: Insufficient documentation

## 2011-03-07 DIAGNOSIS — G2581 Restless legs syndrome: Secondary | ICD-10-CM | POA: Insufficient documentation

## 2011-03-07 DIAGNOSIS — M549 Dorsalgia, unspecified: Secondary | ICD-10-CM | POA: Insufficient documentation

## 2011-03-07 DIAGNOSIS — E876 Hypokalemia: Secondary | ICD-10-CM | POA: Insufficient documentation

## 2011-03-07 DIAGNOSIS — M1990 Unspecified osteoarthritis, unspecified site: Secondary | ICD-10-CM | POA: Insufficient documentation

## 2011-03-07 LAB — DIFFERENTIAL
Lymphs Abs: 2.3 10*3/uL (ref 0.7–4.0)
Monocytes Relative: 7 % (ref 3–12)
Neutro Abs: 8.3 10*3/uL — ABNORMAL HIGH (ref 1.7–7.7)
Neutrophils Relative %: 71 % (ref 43–77)

## 2011-03-07 LAB — COMPREHENSIVE METABOLIC PANEL
ALT: 20 U/L (ref 0–35)
AST: 19 U/L (ref 0–37)
CO2: 30 mEq/L (ref 19–32)
Calcium: 9.4 mg/dL (ref 8.4–10.5)
Chloride: 100 mEq/L (ref 96–112)
GFR calc non Af Amer: >60 mL/min (ref >60)
Sodium: 138 mEq/L (ref 135–145)

## 2011-03-07 LAB — BASIC METABOLIC PANEL
BUN: 13 mg/dL (ref 6–23)
CO2: 28 mEq/L (ref 19–32)
Chloride: 100 mEq/L (ref 96–112)
GFR calc Af Amer: >60 mL/min (ref >60)
Glucose, Bld: 192 mg/dL — ABNORMAL HIGH (ref 70–99)
Potassium: 2.7 mEq/L — CL (ref 3.5–5.1)

## 2011-03-07 LAB — CBC
HCT: 38.7 % (ref 36.0–46.0)
Hemoglobin: 13.9 g/dL (ref 12.0–15.0)
MCH: 30.8 pg (ref 26.0–34.0)
MCV: 85.6 fL (ref 78.0–100.0)
RBC: 4.52 MIL/uL (ref 3.87–5.11)

## 2011-03-07 LAB — APTT: aPTT: 31 seconds (ref 24–37)

## 2011-03-07 LAB — HEMOGLOBIN A1C
Hgb A1c MFr Bld: 5.8 % — ABNORMAL HIGH (ref <5.7)
Mean Plasma Glucose: 120 mg/dL — ABNORMAL HIGH (ref <117)

## 2011-03-07 LAB — MAGNESIUM: Magnesium: 2.2 mg/dL (ref 1.5–2.5)

## 2011-03-08 ENCOUNTER — Observation Stay (HOSPITAL_COMMUNITY): Payer: Medicare Other

## 2011-03-08 LAB — BASIC METABOLIC PANEL
Chloride: 104 mEq/L (ref 96–112)
GFR calc Af Amer: >60 mL/min (ref >60)
GFR calc non Af Amer: 54 mL/min — ABNORMAL LOW (ref >60)
Potassium: 3.9 mEq/L (ref 3.5–5.1)
Sodium: 139 mEq/L (ref 135–145)

## 2011-03-08 LAB — CARDIAC PANEL(CRET KIN+CKTOT+MB+TROPI)
Relative Index: INVALID (ref 0.0–2.5)
Total CK: 28 U/L (ref 7–177)
Troponin I: <0.3 ng/mL (ref <0.30)

## 2011-03-08 LAB — GLUCOSE, CAPILLARY: Glucose-Capillary: 109 mg/dL — ABNORMAL HIGH (ref 70–99)

## 2011-03-14 NOTE — H&P (Signed)
NAMEARTA, STUMP NO.:  [id number]  MEDICAL RECORD NO.:  [id number]  LOCATION:  [facility]                         FACILITY:  [facility]  PHYSICIAN:  [place] Daphnee Preiss, MD         DATE OF BIRTH:  1951/05/11  DATE OF ADMISSION:  03/07/2011 DATE OF DISCHARGE:                             HISTORY & PHYSICAL   HISTORY OF PRESENT ILLNESS:  [name] is a 60 year old female who has been seen by our group in the past.  She had an LAD drug-eluting stent in 2005 with a jailed diagonal.  She has had 4 catheterizations since then.  She actually was dismissed from our practice in 2005 and we had not seen her until she presented November 2011 with unstable angina. She was just restudied November 2011 for chest pain and this showed no LAD in-stent restenosis, normal circumflex, normal RCA, normal LV function.  She did not keep her followup appointment after her November 2011 hospitalization.  Much of her noncompliance with medications and office visits has been secondary to finances.  She says now that she is on Medicaid since July of this year.  Last night, she had some midsternal chest pain while lying in bed watching TV.  She called Dr. Fredirick Maudlin office this morning and was seen there.  They referred her to [facility] ER for further evaluation.  The patient thinks her symptoms were secondary to esophageal reflux.  She did take an dose of antacid and also an Ultram with some relief.  She is currently pain free.  HOME MEDICATIONS: 1. Paxil 20 mg a day. 2. Valtrex 500 mg p.r.n. for fever blisters. 3. Tramadol 50 mg every.6 h. P.r.n. 4. Prilosec 20 mg b.i.d. 5. Mylanta p.r.n. 6. Neurontin 300 mg t.i.d. 7. Requip 0.25 mg daily. 8. Lisinopril 40 mg a day. 9. Elmiron 100 mg daily. 10.Norvasc 5 mg a day.  ALLERGIES:  She has reported allergy to CIPRO.  PAST MEDICAL HISTORY: 1. Remarkable for neuropathy. 2. She carries a diagnosis of asthma. 3. She has metabolic syndrome. 4. She has some  DJD and chronic back pain. 5. She says she has a history now of fibromyalgia. 6. She did have admission to behavior health in June 2010 for suicidal     ideations. 7. She has had a previous hysterectomy and hernia repair.  SOCIAL HISTORY:  She is divorced.  She has one son, lives in [place]. She lives in a mobile home.  She is nonsmoker.  She is now disabled.  FAMILY HISTORY:  Remarkable for mother having an MI at 28 and her father had an MI at 57.  REVIEW OF SYSTEMS:  Essentially unremarkable except as noted above.  She denies any GI bleeding or melena.  She does have chronic cystitis and restless legs syndrome.  PHYSICAL EXAMINATION:  GENERAL:  Blood pressure 122/82, pulse 70, temperature 97.8. GENERAL:  She is well-developed, well-nourished female in no acute distress. HEENT:  Normocephalic.  Extraocular movements are intact.  Sclerae are nonicteric.  She wears glasses. NECK:  Without JVD or bruit. CHEST:  Clear to auscultation and percussion. CARDIAC:  Regular rate and rhythm without murmur, rub or gallop.  Normal S1, S2. ABDOMEN:  Obese, nontender.  No hepatosplenomegaly. EXTREMITIES:  Trace pretibial pitting edema.  Good distal pulses. NEURO:  Grossly intact.  She is awake, alert, oriented, operative. Moves all extremities without obvious deficit. SKIN:  Cool and dry.  LABORATORY DATA:  EKG shows sinus rhythm without acute changes.  Chest x- ray is negative.  White count 11.7, hemoglobin 13.9, hematocrit 38.7, platelets 396.  Troponin is negative x1.  IMPRESSION: 1. Chest pain, somewhat atypical for angina. 2. Known coronary disease with LAD drug-eluting stent in 2005 with 4     subsequent catheterizations showing patency of the site and no     other significant coronary disease except for a small jailed     diagonal.  Last catheterization was November 2011. 3. Normal LV function. 4. Fibromyalgia. 5. Neuropathy. 6. Degenerative joint disease with chronic back  pain, patient is     disabled. 7. Metabolic syndrome. 8. Restless legs.  PLAN:  We will check a second troponin in the emergency room.  We may be able to continue medical therapy and possibly send her home versus overnight observation.     Abelino Derrick, P.A.   ______________________________ [place] Latarshia Jersey, MD    LKK/MEDQ  D:  03/07/2011  T:  03/07/2011  Job:  [id number]  cc:   Aida Puffer, MD  Electronically Signed by Corine Shelter P.A. on 03/12/2011 10:28:31 AM Electronically Signed by Kirtland Bouchard. Rajanee Schuelke M.D. on 03/14/2011 78:29:56 AM

## 2011-03-20 NOTE — Discharge Summary (Signed)
[name], [name] NO.:  [id number]  MEDICAL RECORD NO.:  [id number]  LOCATION:  2023                         FACILITY:  [facility]  PHYSICIAN:  [name], M.D.     DATE OF BIRTH:  May 13, 1951  DATE OF ADMISSION:  03/07/2011 DATE OF DISCHARGE:  03/08/2011                              DISCHARGE SUMMARY   DISCHARGE DIAGNOSES: 1. Chest pain with negative myocardial infarction secondary to     gastroesophageal reflux disease with medication adjustment. 2. Significant hypokalemia, now resolved. 3. Metabolic syndrome. 4. Coronary disease with history of stent to the left anterior     descending coronary artery in 2005 and stable coronary arteries in     November 2011 with normal left ventricular function. 5. Sinus bradycardia on no beta-blocker or calcium channel blocker,     but asymptomatic. 6. Fibromyalgia. 7. Neuropathy. 8. Degenerative joint disease and chronic back pain.  DISCHARGE CONDITION:  Improved.  PROCEDURES:  None.  DISCHARGE MEDICATIONS:  See medication reconciliation sheet.  Please note her admit med rec was different than her list from her primary care, which we will go by.  The patient has no idea what medications she takes, so I am unsure any medication list is correct for this patient, but medications were continued as there were copied from the primary care.  We did change her Prilosec to Protonix twice a day for 1 month and then she could go back to her Prilosec.  DISCHARGE INSTRUCTIONS: 1. Increase activity slowly.  No restrictions really. 2. Diet:  Low sodium, heart healthy, watch sweets, decrease the     sweets.  Use white breads 3. Follow up with Dr. Aida Jefferson for followup.  HISTORY OF PRESENT ILLNESS AND HOSPITAL COURSE:  A 60 year old female with coronary artery disease with drug-eluting stent in 2005 and four heart cath since then, and her last one being November 2011 that revealed no in-stent restenosis, normal circ,  normal RCA, normal LV function.  She has trouble with medication secondary to finances, but has restarted receiving Medicaid since July 2012.  The patient developed midsternal chest pain while lying in bed.  She was seen by Dr. Gatha Jefferson office and they referred her to [facility] for evaluation.  The patient thought this was all esophageal reflux, and antacids and Ultram did help her discomfort.  Other history includes neuropathy, asthma, metabolic syndrome, chronic back pain, fibromyalgia, and in 2010, she had admission for suicidal ideation.  She was admitted.  Cardiac enzymes were negative for MI.  Her Prilosec was changed to Protonix twice a day.  She has been stable since that time, very sleepy today.  She says she has not slept in 3 days.  EKG is sinus bradycardia, heart rate down to 50.  At times, her heart rate was 43 during the night, but she will ambulate before discharge to ensure she is stable and will be discharged home.  OTHER LABORATORY DATA:  Hemoglobin 13.9, hematocrit 38.7, WBC 11.7, neutrophils 71, lymph 20, mono 70, eos 3.  Pro time 14, INR of 1.06, PTT 31.  We did do Accu-Cheks here as the patient has metabolic syndrome 117- 135.  Chemistry:  Sodium 139, potassium 3.9, chloride 104, CO2 of 30, glucose 110, BUN 12, creatinine 1.05.  Alkaline phos was slightly elevated at 130, magnesium was 2.2.  Please note on admission, her initial potassium was 2.7.  She was given supplementation and it is 3.9 this morning.  Cardiac enzymes were all negative with CKs 28-26, MBs 1.3- 1.4, troponin I less than 0.30.  TSH was slightly elevated at 5.196, we will have Dr. Clarene Jefferson follow up with her thyroid issues.  RADIOLOGY:  Improved lung volumes without evidence for residual airspace disease or vascular congestion, borderline cardiomegaly without failure. Initial chest x-ray felt she had low lung volumes and increase in heart silhouette.  They felt that was projectional.  She had  mild central venous congestion, but that is resolved as well and at discharge, she continues with borderline cardiomegaly without failure.  EKGs as stated, sinus brady with no acute changes.     [name]. [name], N.P.   ______________________________ [name], M.D.    LRI/MEDQ  D:  03/08/2011  T:  03/08/2011  Job:  [id number]  cc:   Victoria Puffer, MD  Electronically Signed by [name] N.P. on 03/13/2011 07:50:10 AM Electronically Signed by [name] M.D. on 03/20/2011 12:09:53 PM

## 2011-07-09 DIAGNOSIS — I639 Cerebral infarction, unspecified: Secondary | ICD-10-CM

## 2011-07-09 HISTORY — DX: Cerebral infarction, unspecified: I63.9

## 2012-09-09 ENCOUNTER — Encounter (HOSPITAL_COMMUNITY): Payer: Self-pay | Admitting: Emergency Medicine

## 2012-09-09 ENCOUNTER — Inpatient Hospital Stay (HOSPITAL_COMMUNITY)
Admission: EM | Admit: 2012-09-09 | Discharge: 2012-09-18 | DRG: 247 | Disposition: A | Discharge status: Home / Self Care | Payer: Medicare Other | Attending: Internal Medicine | Admitting: Internal Medicine

## 2012-09-09 ENCOUNTER — Emergency Department (HOSPITAL_COMMUNITY): Payer: Medicare Other

## 2012-09-09 DIAGNOSIS — Z8673 Personal history of transient ischemic attack (TIA), and cerebral infarction without residual deficits: Secondary | ICD-10-CM

## 2012-09-09 DIAGNOSIS — I214 Non-ST elevation (NSTEMI) myocardial infarction: Principal | ICD-10-CM

## 2012-09-09 DIAGNOSIS — E876 Hypokalemia: Secondary | ICD-10-CM | POA: Diagnosis present

## 2012-09-09 DIAGNOSIS — I959 Hypotension, unspecified: Secondary | ICD-10-CM

## 2012-09-09 DIAGNOSIS — M549 Dorsalgia, unspecified: Secondary | ICD-10-CM | POA: Diagnosis present

## 2012-09-09 DIAGNOSIS — I200 Unstable angina: Secondary | ICD-10-CM

## 2012-09-09 DIAGNOSIS — R7989 Other specified abnormal findings of blood chemistry: Secondary | ICD-10-CM

## 2012-09-09 DIAGNOSIS — T82897A Other specified complication of cardiac prosthetic devices, implants and grafts, initial encounter: Secondary | ICD-10-CM | POA: Diagnosis present

## 2012-09-09 DIAGNOSIS — R079 Chest pain, unspecified: Secondary | ICD-10-CM

## 2012-09-09 DIAGNOSIS — Z79899 Other long term (current) drug therapy: Secondary | ICD-10-CM

## 2012-09-09 DIAGNOSIS — N189 Chronic kidney disease, unspecified: Secondary | ICD-10-CM | POA: Diagnosis present

## 2012-09-09 DIAGNOSIS — I10 Essential (primary) hypertension: Secondary | ICD-10-CM

## 2012-09-09 DIAGNOSIS — I2510 Atherosclerotic heart disease of native coronary artery without angina pectoris: Secondary | ICD-10-CM

## 2012-09-09 DIAGNOSIS — I129 Hypertensive chronic kidney disease with stage 1 through stage 4 chronic kidney disease, or unspecified chronic kidney disease: Secondary | ICD-10-CM | POA: Diagnosis present

## 2012-09-09 DIAGNOSIS — G894 Chronic pain syndrome: Secondary | ICD-10-CM

## 2012-09-09 DIAGNOSIS — E785 Hyperlipidemia, unspecified: Secondary | ICD-10-CM | POA: Diagnosis present

## 2012-09-09 DIAGNOSIS — Z7982 Long term (current) use of aspirin: Secondary | ICD-10-CM

## 2012-09-09 DIAGNOSIS — Z9181 History of falling: Secondary | ICD-10-CM

## 2012-09-09 DIAGNOSIS — F411 Generalized anxiety disorder: Secondary | ICD-10-CM | POA: Diagnosis present

## 2012-09-09 DIAGNOSIS — K219 Gastro-esophageal reflux disease without esophagitis: Secondary | ICD-10-CM | POA: Diagnosis present

## 2012-09-09 DIAGNOSIS — Y849 Medical procedure, unspecified as the cause of abnormal reaction of the patient, or of later complication, without mention of misadventure at the time of the procedure: Secondary | ICD-10-CM | POA: Diagnosis present

## 2012-09-09 DIAGNOSIS — K602 Anal fissure, unspecified: Secondary | ICD-10-CM

## 2012-09-09 DIAGNOSIS — I951 Orthostatic hypotension: Secondary | ICD-10-CM

## 2012-09-09 DIAGNOSIS — N3020 Other chronic cystitis without hematuria: Secondary | ICD-10-CM

## 2012-09-09 DIAGNOSIS — N179 Acute kidney failure, unspecified: Secondary | ICD-10-CM

## 2012-09-09 HISTORY — DX: Unspecified asthma, uncomplicated: J45.909

## 2012-09-09 HISTORY — DX: Dorsalgia, unspecified: M54.9

## 2012-09-09 HISTORY — DX: Myoneural disorder, unspecified: G70.9

## 2012-09-09 HISTORY — DX: Hyperlipidemia, unspecified: E78.5

## 2012-09-09 HISTORY — DX: Pneumonia, unspecified organism: J18.9

## 2012-09-09 HISTORY — DX: Depression, unspecified: F32.A

## 2012-09-09 HISTORY — DX: Headache: R51

## 2012-09-09 HISTORY — DX: Unspecified osteoarthritis, unspecified site: M19.90

## 2012-09-09 HISTORY — DX: Gastro-esophageal reflux disease without esophagitis: K21.9

## 2012-09-09 HISTORY — DX: Anemia, unspecified: D64.9

## 2012-09-09 HISTORY — DX: Major depressive disorder, single episode, unspecified: F32.9

## 2012-09-09 HISTORY — DX: Anxiety disorder, unspecified: F41.9

## 2012-09-09 HISTORY — PX: CARDIAC CATHETERIZATION: SHX172

## 2012-09-09 HISTORY — DX: Other chronic pain: G89.29

## 2012-09-09 HISTORY — DX: Atherosclerotic heart disease of native coronary artery without angina pectoris: I25.10

## 2012-09-09 HISTORY — DX: Fibromyalgia: M79.7

## 2012-09-09 HISTORY — DX: Peripheral vascular disease, unspecified: I73.9

## 2012-09-09 LAB — COMPREHENSIVE METABOLIC PANEL
ALT: 23 U/L (ref 0–35)
AST: 27 U/L (ref 0–37)
Albumin: 3.8 g/dL (ref 3.5–5.2)
Alkaline Phosphatase: 129 U/L — ABNORMAL HIGH (ref 39–117)
BUN: 24 mg/dL — ABNORMAL HIGH (ref 6–23)
CO2: 26 mEq/L (ref 19–32)
Calcium: 9.7 mg/dL (ref 8.4–10.5)
Chloride: 97 mEq/L (ref 96–112)
Creatinine, Ser: 3.44 mg/dL — ABNORMAL HIGH (ref 0.50–1.10)
GFR calc Af Amer: 15 mL/min — ABNORMAL LOW (ref >90)
GFR calc non Af Amer: 13 mL/min — ABNORMAL LOW (ref >90)
Glucose, Bld: 175 mg/dL — ABNORMAL HIGH (ref 70–99)
Potassium: 3.4 mEq/L — ABNORMAL LOW (ref 3.5–5.1)
Sodium: 136 mEq/L (ref 135–145)
Total Bilirubin: 0.3 mg/dL (ref 0.3–1.2)
Total Protein: 7.3 g/dL (ref 6.0–8.3)

## 2012-09-09 LAB — URINALYSIS, ROUTINE W REFLEX MICROSCOPIC
Glucose, UA: NEGATIVE mg/dL
Hgb urine dipstick: NEGATIVE
Ketones, ur: 15 mg/dL — AB
Nitrite: NEGATIVE
Protein, ur: 100 mg/dL — AB
Specific Gravity, Urine: 1.022 (ref 1.005–1.030)
Urobilinogen, UA: 0.2 mg/dL (ref 0.0–1.0)
pH: 5 (ref 5.0–8.0)

## 2012-09-09 LAB — POCT I-STAT TROPONIN I: Troponin i, poc: 0.71 ng/mL (ref 0.00–0.08)

## 2012-09-09 LAB — URINE MICROSCOPIC-ADD ON

## 2012-09-09 LAB — CBC
HCT: 43.4 % (ref 36.0–46.0)
Hemoglobin: 15.6 g/dL — ABNORMAL HIGH (ref 12.0–15.0)
MCH: 32 pg (ref 26.0–34.0)
MCHC: 35.9 g/dL (ref 30.0–36.0)
MCV: 89.1 fL (ref 78.0–100.0)
Platelets: 428 10*3/uL — ABNORMAL HIGH (ref 150–400)
RBC: 4.87 MIL/uL (ref 3.87–5.11)
RDW: 13.6 % (ref 11.5–15.5)
WBC: 12.8 10*3/uL — ABNORMAL HIGH (ref 4.0–10.5)

## 2012-09-09 MED ORDER — CARVEDILOL 3.125 MG PO TABS
3.1250 mg | ORAL_TABLET | Freq: Two times a day (BID) | ORAL | Status: DC
  Administered 2012-09-10 – 2012-09-16 (×11): 3.125 mg via ORAL
  Filled 2012-09-09 (×15): qty 1

## 2012-09-09 MED ORDER — SODIUM CHLORIDE 0.9 % IV SOLN
INTRAVENOUS | Status: DC
  Administered 2012-09-09 – 2012-09-10 (×2): via INTRAVENOUS
  Administered 2012-09-10: 100 mL/h via INTRAVENOUS
  Administered 2012-09-11 – 2012-09-13 (×4): via INTRAVENOUS

## 2012-09-09 MED ORDER — DOCUSATE SODIUM 100 MG PO CAPS
100.0000 mg | ORAL_CAPSULE | Freq: Two times a day (BID) | ORAL | Status: DC
  Administered 2012-09-10 – 2012-09-18 (×12): 100 mg via ORAL
  Filled 2012-09-09 (×19): qty 1

## 2012-09-09 MED ORDER — ATORVASTATIN CALCIUM 20 MG PO TABS
20.0000 mg | ORAL_TABLET | Freq: Every day | ORAL | Status: DC
  Administered 2012-09-10 – 2012-09-17 (×7): 20 mg via ORAL
  Filled 2012-09-09 (×9): qty 1

## 2012-09-09 MED ORDER — CLORAZEPATE DIPOTASSIUM 3.75 MG PO TABS
15.0000 mg | ORAL_TABLET | Freq: Two times a day (BID) | ORAL | Status: DC
  Administered 2012-09-10 (×2): 15 mg via ORAL
  Filled 2012-09-09 (×2): qty 4

## 2012-09-09 MED ORDER — ASPIRIN 81 MG PO CHEW
324.0000 mg | CHEWABLE_TABLET | Freq: Once | ORAL | Status: AC
  Administered 2012-09-09: 324 mg via ORAL
  Filled 2012-09-09: qty 4

## 2012-09-09 MED ORDER — ONDANSETRON HCL 4 MG PO TABS: 4.0000 mg | ORAL_TABLET | Freq: Four times a day (QID) | ORAL | Status: DC | PRN

## 2012-09-09 MED ORDER — SODIUM CHLORIDE 0.9 % IJ SOLN
3.0000 mL | Freq: Two times a day (BID) | INTRAMUSCULAR | Status: DC
  Administered 2012-09-10 – 2012-09-13 (×7): 3 mL via INTRAVENOUS

## 2012-09-09 MED ORDER — ONDANSETRON HCL 4 MG/2ML IJ SOLN: 4.0000 mg | Freq: Four times a day (QID) | INTRAMUSCULAR | Status: DC | PRN

## 2012-09-09 MED ORDER — HEPARIN BOLUS VIA INFUSION
4000.0000 [IU] | Freq: Once | INTRAVENOUS | Status: AC
  Administered 2012-09-09: 4000 [IU] via INTRAVENOUS

## 2012-09-09 MED ORDER — TRAMADOL HCL 50 MG PO TABS
50.0000 mg | ORAL_TABLET | Freq: Four times a day (QID) | ORAL | Status: DC | PRN
  Administered 2012-09-09 – 2012-09-17 (×9): 50 mg via ORAL
  Filled 2012-09-09 (×10): qty 1

## 2012-09-09 MED ORDER — PENTOSAN POLYSULFATE SODIUM 100 MG PO CAPS
100.0000 mg | ORAL_CAPSULE | Freq: Every day | ORAL | Status: DC
  Administered 2012-09-10 – 2012-09-18 (×9): 100 mg via ORAL
  Filled 2012-09-09 (×9): qty 1

## 2012-09-09 MED ORDER — OXYCODONE HCL ER 10 MG PO T12A
10.0000 mg | EXTENDED_RELEASE_TABLET | Freq: Four times a day (QID) | ORAL | Status: DC
  Administered 2012-09-10 (×3): 10 mg via ORAL
  Filled 2012-09-09 (×4): qty 1

## 2012-09-09 MED ORDER — CLONAZEPAM 0.5 MG PO TABS
0.5000 mg | ORAL_TABLET | Freq: Two times a day (BID) | ORAL | Status: DC
  Administered 2012-09-10 (×2): 0.5 mg via ORAL
  Filled 2012-09-09 (×3): qty 1

## 2012-09-09 MED ORDER — GABAPENTIN 600 MG PO TABS
600.0000 mg | ORAL_TABLET | Freq: Three times a day (TID) | ORAL | Status: DC
  Administered 2012-09-10 – 2012-09-11 (×5): 600 mg via ORAL
  Filled 2012-09-09 (×7): qty 1

## 2012-09-09 MED ORDER — ASPIRIN EC 81 MG PO TBEC
81.0000 mg | DELAYED_RELEASE_TABLET | Freq: Every day | ORAL | Status: DC
  Administered 2012-09-10 – 2012-09-18 (×8): 81 mg via ORAL
  Filled 2012-09-09 (×9): qty 1

## 2012-09-09 MED ORDER — HEPARIN (PORCINE) IN NACL 100-0.45 UNIT/ML-% IJ SOLN
1000.0000 [IU]/h | INTRAMUSCULAR | Status: DC
  Administered 2012-09-09: 900 [IU]/h via INTRAVENOUS
  Administered 2012-09-10: 1000 [IU]/h via INTRAVENOUS
  Filled 2012-09-09 (×4): qty 250

## 2012-09-09 MED ORDER — BUPROPION HCL ER (SR) 150 MG PO TB12
150.0000 mg | ORAL_TABLET | Freq: Every day | ORAL | Status: DC
  Administered 2012-09-10 – 2012-09-18 (×9): 150 mg via ORAL
  Filled 2012-09-09 (×9): qty 1

## 2012-09-09 MED ORDER — PANTOPRAZOLE SODIUM 40 MG PO TBEC
40.0000 mg | DELAYED_RELEASE_TABLET | Freq: Every day | ORAL | Status: DC
  Administered 2012-09-10 – 2012-09-18 (×9): 40 mg via ORAL
  Filled 2012-09-09 (×8): qty 1

## 2012-09-09 NOTE — ED Notes (Signed)
Fall risk band place don patient.   

## 2012-09-09 NOTE — ED Notes (Signed)
Waiting on Lipitor to be delivered from pharmacy.

## 2012-09-09 NOTE — ED Notes (Signed)
[name] -- [phone].  Patient contact information.

## 2012-09-09 NOTE — ED Provider Notes (Signed)
I saw and evaluated the patient, reviewed the resident's note and I agree with the findings and plan.  EKG:  Rhythm: normal sinus Rate: 73 Axis: normal Intervals: normal LVH ST segments: NS ST changes  61yF with CP. Pt poor historian. Says has been having CP for about a week. Reports recent admit at [facility] for same. Discussed with secretary to obtain these records. Elevated trop. Known CAD. ASA and heparin gtt. Presumably ARF. Hypotensive. IVF.I discussed case with Dr. Royann Shivers. Requesting hospitalist admission and cardiology will see pt in morning.   CRITICAL CARE Performed by: Raeford Razor   Total critical care time: 30 minutes  Critical care time was exclusive of separately billable procedures and treating other patients.  Critical care was necessary to treat or prevent imminent or life-threatening deterioration.  Critical care was time spent personally by me on the following activities: development of treatment plan with patient and/or surrogate as well as nursing, discussions with consultants, evaluation of patient's response to treatment, examination of patient, obtaining history from patient or surrogate, ordering and performing treatments and interventions, ordering and review of laboratory studies, ordering and review of radiographic studies, pulse oximetry and re-evaluation of patient's condition.   Raeford Razor, MD 09/10/12 [phone]

## 2012-09-09 NOTE — ED Notes (Signed)
Calling report now. 

## 2012-09-09 NOTE — ED Notes (Signed)
Patient given [place] sandwich per Herbert Seta, RN

## 2012-09-09 NOTE — ED Notes (Signed)
Patient currently resting quietly in bed; no respiratory or acute distress.  Patient given something to eat, per request.  Patient denies any other needs at this time; will continue to monitor.

## 2012-09-09 NOTE — Progress Notes (Signed)
ANTICOAGULATION CONSULT NOTE - Initial Consult  Pharmacy Consult for heparin Indication: chest pain/ACS  Not on File  Patient Measurements:   Heparin Dosing Weight: 74.3kg  Vital Signs: Temp: 97.6 F (36.4 C) (03/05 1920) Temp src: Oral (03/05 1920) BP: 91/58 mmHg (03/05 1920) Pulse Rate: 84 (03/05 1920)  Labs:  Recent Labs  09/09/12 1803  HGB 15.6*  HCT 43.4  PLT 428*  CREATININE 3.44*    The CrCl is unknown because both a height and weight (above a minimum accepted value) are required for this calculation.   Medical History: No past medical history on file.  Medications:  Infusions:  . heparin    . heparin      Assessment: 60 yof with dizziness and CP presented to the ED with elevated troponins. To start IV heparin. Baseline H/H and plts are ok. She is not on any anticoagulants PTA. Scr is significantly elevated.   Goal of Therapy:  Heparin level 0.3-0.7 units/ml Monitor platelets by anticoagulation protocol: Yes   Plan:  1. Heparin bolus 4000 units IV x 1 2. Heparin gtt 900 units/hr 3. Check an 8 hour heparin level 4. Daily heparin level and CBC  Rumbarger, Drake Leach 09/09/2012,7:37 PM

## 2012-09-09 NOTE — ED Notes (Signed)
Patient currently resting quietly in bed; no respiratory or acute distress noted.  Patient updated on plan of care; informed patient that EDP has made consult to cardiologist/internal medicine.  Patient denies any needs at this time; will continue to monitor.

## 2012-09-09 NOTE — ED Notes (Signed)
Received bedside report from [place], [place].  Patient currently resting quietly in bed; no respiratory or acute distress noted.  Patient updated on plan of care; informed patient that we are currently waiting on Heparin to be delivered from pharmacy.  Patient denies any needs at this time; will continue to monitor.

## 2012-09-09 NOTE — ED Provider Notes (Signed)
History     CSN: [id number]  Arrival date & time 09/09/12  1750   First MD Initiated Contact with Patient 09/09/12 1811      Chief Complaint  Patient presents with  . Hypotension  . Chest Pain    HPI NYESHIA MYSLIWIEC is a 62 y.o. female who presents to the ED from PCP's office for hypotension.  Patient reports that she has been having several episodes of left sided chest pain over last ten months.  Reports that this pain has been present over last week and a half.  Waxing and waning but never completely gone away.  Now 9/10 in severity.  Radiates to L arm.  No fevers.  No cough.  No history of DVT/PE.  Described as anginal equivalent.  Recently admitted in [place] last week and was given stress test that she reports was normal.    Past Medical History  Diagnosis Date  . CAD in native artery   . HTN (hypertension), benign   . Hyperlipidemia   . Depression   . Chronic back pain   . GERD (gastroesophageal reflux disease)     History reviewed. No pertinent past surgical history.  Family History  Problem Relation Age of Onset  . CAD Mother   . CAD Brother     History  Substance Use Topics  . Smoking status: Not on file  . Smokeless tobacco: Not on file  . Alcohol Use: Not on file    OB History   Grav Para Term Preterm Abortions TAB SAB Ect Mult Living                  Review of Systems  Constitutional: Negative for fever and chills.  HENT: Negative for congestion, rhinorrhea, neck pain and neck stiffness.   Respiratory: Negative for cough and shortness of breath.   Cardiovascular: Positive for chest pain.  Gastrointestinal: Negative for nausea, vomiting, abdominal pain, diarrhea and abdominal distention.  Endocrine: Negative for polyuria.  Genitourinary: Negative for dysuria.  Skin: Negative for rash.  Neurological: Negative for headaches.  Psychiatric/Behavioral: Negative.   All other systems reviewed and are negative.    Allergies  Review of patient's  allergies indicates no known allergies.  Home Medications  No current outpatient prescriptions on file.  BP 99/57  Pulse 83  SpO2 95%  Physical Exam  Nursing note and vitals reviewed. Constitutional: She is oriented to person, place, and time. She appears well-developed and well-nourished. No distress.  HENT:  Head: Normocephalic and atraumatic.  Right Ear: External ear normal.  Left Ear: External ear normal.  Nose: Nose normal.  Mouth/Throat: Oropharynx is clear and moist. No oropharyngeal exudate.  Eyes: EOM are normal. Pupils are equal, round, and reactive to light.  Neck: Normal range of motion. Neck supple. No tracheal deviation present.  Cardiovascular: Normal rate.   Pulmonary/Chest: Effort normal and breath sounds normal. No stridor. No respiratory distress. She has no wheezes. She has no rales.  Abdominal: Soft. She exhibits no distension. There is no tenderness. There is no rebound.  Musculoskeletal: Normal range of motion.  Neurological: She is alert and oriented to person, place, and time.  Skin: Skin is warm and dry. She is not diaphoretic.    ED Course  Procedures (including critical care time)  Labs Reviewed  CBC - Abnormal; Notable for the following:    WBC 12.8 (*)    Hemoglobin 15.6 (*)    Platelets 428 (*)    All other components within  normal limits  COMPREHENSIVE METABOLIC PANEL - Abnormal; Notable for the following:    Potassium 3.4 (*)    Glucose, Bld 175 (*)    BUN 24 (*)    Creatinine, Ser 3.44 (*)    Alkaline Phosphatase 129 (*)    GFR calc non Af Amer 13 (*)    GFR calc Af Amer 15 (*)    All other components within normal limits  URINALYSIS, ROUTINE W REFLEX MICROSCOPIC - Abnormal; Notable for the following:    Color, Urine AMBER (*)    Bilirubin Urine SMALL (*)    Ketones, ur 15 (*)    Protein, ur 100 (*)    Leukocytes, UA LARGE (*)    All other components within normal limits  URINE MICROSCOPIC-ADD ON - Abnormal; Notable for the  following:    Squamous Epithelial / LPF FEW (*)    Bacteria, UA FEW (*)    Casts GRANULAR CAST (*)    All other components within normal limits  POCT I-STAT TROPONIN I - Abnormal; Notable for the following:    Troponin i, poc 0.71 (*)    All other components within normal limits  URINE CULTURE  HEPARIN LEVEL (UNFRACTIONATED)  CBC  TSH  TROPONIN I  TROPONIN I  TROPONIN I  BASIC METABOLIC PANEL   Dg Chest Portable 1 View  09/09/2012  *RADIOLOGY REPORT*  Clinical Data: Hypotension, chest pain, shortness of breath, dizziness  PORTABLE CHEST - 1 VIEW  Comparison: 09/02/2012  Findings: Heart size upper limits normal for technique. There are low lung volumes with resultant crowding of bronchovascular structures.  No focal infiltrate.  No overt edema.  No effusion. Degenerative spurring in bilateral shoulders.  IMPRESSION:  1.  Low volumes.  No acute disease.   Original Report Authenticated By: D. Andria Rhein, MD      1. ARF (acute renal failure)   2. CAD in native artery   3. Elevated troponin   4. Hypotension arterial       MDM   62 year old female presents to the emergency department with several weeks of waxing and waning chest pain. She was seen at her PCPs office today and that she was found to have a blood pressure of 70 systolic. Normal GCS and pulse. EMS contacted for transfer the emergency department. On arrival here her systolic was 90 and she was in no acute distress. Chest x-ray was normal. Large marble for troponin of 0.71 and she was noted to be in acute renal failure. Patient rehydrated with normal saline. Given positive troponin history of coronary artery disease with 2 stents, cardiology was consulted. Cardiology recommending starting heparin drip. A heparin drip was started. Some consultation for admission given acute renal failure, chest pain with positive troponin, volume depletion, and hypotension. After evaluation by hospitalist on an emergency department patient  admitted. No need for intensive care. Doubt sepsis or septic shock in this patient.       Arloa Koh, MD 09/10/12 0003

## 2012-09-09 NOTE — ED Notes (Signed)
EDP at bedside  

## 2012-09-09 NOTE — ED Notes (Signed)
Report given to Weed, RN on 4700.  No further questions/concerns from RN.  Informed RN that she can call back with any questions/concerns once patient arrives to floor.  Preparing patient for transport.

## 2012-09-09 NOTE — H&P (Signed)
[facility] History and Physical  [name] HYQ:[id number] DOB: [birth year]    PCP:  Dr Clarene [facility] of [facility].  Chief Complaint: Persistent chest pain and lightheadedness  HPI: [name] is an 62 y.o. female with hx of known CAD, s/p 2 cardiac stent placement, recent admission to [facility] for chest pain, where she had a stress echo showing no inducible ischemia, normal left ventricular fx, at 85% of predicted, discharged on Lisinopril at 40mg  per day, presents to her PCP where she was found to be hypotensive with SBP of 90, orthostatic symptomology, and persistent CP.  She was sent to [facility] ER.  Evaluation in the ER showed EKG without any acute ST-T changes, but her Cr was found to be 3.44 (previously normal Cr), and hypotensive with SBP of 90.  She also has elevated troponin to 0.71.  Her CXR was clear. Cardiology was consulted and recommended IV heparin and they will see her in consultation.  Hospitalist was asked to admit her for chest pain, hypotension and ARF.  Rewiew of Systems:  Constitutional: Negative for malaise, fever and chills. No significant weight loss or weight gain Eyes: Negative for eye pain, redness and discharge, diplopia, visual changes, or flashes of light. ENMT: Negative for ear pain, hoarseness, nasal congestion, sinus pressure and sore throat. No headaches; tinnitus, drooling, or problem swallowing. Cardiovascular: Negative for  palpitations, diaphoresis, dyspnea and peripheral edema. ; No orthopnea, PND Respiratory: Negative for cough, hemoptysis, wheezing and stridor. No pleuritic chestpain. Gastrointestinal: Negative for nausea, vomiting, diarrhea, constipation, abdominal pain, melena, blood in stool, hematemesis, jaundice and rectal bleeding.    Genitourinary: Negative for frequency, dysuria, incontinence,flank pain and hematuria; Musculoskeletal: Negative for back pain and neck pain. Negative for swelling and trauma.;  Skin: . Negative for pruritus,  rash, abrasions, bruising and skin lesion.; ulcerations Neuro: Negative for headache,and neck stiffness. Negative for weakness, altered level of consciousness , altered mental status, extremity weakness, burning feet, involuntary movement, seizure and syncope.  Psych: negative for  insomnia, tearfulness, panic attacks, hallucinations, paranoia, suicidal or homicidal ideation    Past Medical History  Diagnosis Date  . CAD in native artery   . HTN (hypertension), benign   . Hyperlipidemia   . Depression   . Chronic back pain   . GERD (gastroesophageal reflux disease)     History reviewed. No pertinent past surgical history.  Medications:  HOME MEDS: Prior to Admission medications   Medication Sig Start Date End Date Taking? Authorizing Provider  aspirin EC 81 MG tablet Take 81 mg by mouth daily.   Yes Historical Provider, MD  buPROPion (WELLBUTRIN SR) 150 MG 12 hr tablet Take 150 mg by mouth daily.   Yes Historical Provider, MD  carvedilol (COREG) 3.125 MG tablet Take 3.125 mg by mouth 2 (two) times daily with a meal.   Yes Historical Provider, MD  clonazePAM (KLONOPIN) 0.5 MG tablet Take 0.5 mg by mouth 2 (two) times daily.   Yes Historical Provider, MD  clorazepate (TRANXENE) 15 MG tablet Take 15 mg by mouth 2 (two) times daily.   Yes Historical Provider, MD  gabapentin (NEURONTIN) 600 MG tablet Take 600 mg by mouth 3 (three) times daily.   Yes Historical Provider, MD  lisinopril (PRINIVIL,ZESTRIL) 40 MG tablet Take 40 mg by mouth daily.   Yes Historical Provider, MD  naproxen (NAPROSYN) 500 MG tablet Take 500 mg by mouth 2 (two) times daily with a meal.   Yes Historical Provider, MD  nitroGLYCERIN (NITROSTAT) 0.4  MG SL tablet Place 0.4 mg under the tongue every 5 (five) minutes as needed for chest pain.   Yes Historical Provider, MD  omeprazole (PRILOSEC) 40 MG capsule Take 40 mg by mouth 2 (two) times daily.   Yes Historical Provider, MD  oxyCODONE (OXYCONTIN) 10 MG 12 hr tablet  Take 10 mg by mouth every 6 (six) hours.   Yes Historical Provider, MD  pentosan polysulfate (ELMIRON) 100 MG capsule Take 100 mg by mouth daily.   Yes Historical Provider, MD  traMADol (ULTRAM) 50 MG tablet Take 50 mg by mouth every 6 (six) hours as needed for pain.   Yes Historical Provider, MD     Allergies:  Not on File  Social History:   reports that she has never smoked. She does not have any smokeless tobacco history on file. She reports that she does not drink alcohol or use illicit drugs.  Family History: Family History  Problem Relation Age of Onset  . CAD Mother   . CAD Brother      Physical Exam: Filed Vitals:   09/09/12 1920 09/09/12 1938 09/09/12 2000 09/09/12 2030  BP: 91/58 92/56 90/68  96/76  Pulse: 84 77 72 76  Temp: 97.6 F (36.4 C)     TempSrc: Oral     Resp: 19 15 15 16   SpO2: 95% 98% 96% 95%   Blood pressure 96/76, pulse 76, temperature 97.6 F (36.4 C), temperature source Oral, resp. rate 16, SpO2 95.00%.  GEN:  Pleasant  patient lying in the stretcher in no acute distress; cooperative with exam. PSYCH:  alert and oriented x4; does not appear anxious or depressed; affect is appropriate. HEENT: Mucous membranes pink and anicteric; PERRLA; EOM intact; no cervical lymphadenopathy nor thyromegaly or carotid bruit; no JVD; There were no stridor. Neck is very supple. Breasts:: Not examined CHEST WALL: No tenderness CHEST: Normal respiration, clear to auscultation bilaterally.  HEART: Regular rate and rhythm.  There are no murmur, rub, or gallops.   BACK: No kyphosis or scoliosis; no CVA tenderness ABDOMEN: soft and non-tender; no masses, no organomegaly, normal abdominal bowel sounds; no pannus; no intertriginous candida. There is no rebound and no distention. Rectal Exam: Not done EXTREMITIES: No bone or joint deformity; age-appropriate arthropathy of the hands and knees; no edema; no ulcerations.  There is no calf tenderness. Genitalia: not  examined PULSES: 2+ and symmetric SKIN: Normal hydration no rash or ulceration CNS: Cranial nerves 2-12 grossly intact no focal lateralizing neurologic deficit.  Speech is fluent; uvula elevated with phonation, facial symmetry and tongue midline. DTR are normal bilaterally, cerebella exam is intact, barbinski is negative and strengths are equaled bilaterally.  No sensory loss.   Labs on Admission:  Basic Metabolic Panel:  Recent Labs Lab 09/09/12 1803  NA 136  K 3.4*  CL 97  CO2 26  GLUCOSE 175*  BUN 24*  CREATININE 3.44*  CALCIUM 9.7   Liver Function Tests:  Recent Labs Lab 09/09/12 1803  AST 27  ALT 23  ALKPHOS 129*  BILITOT 0.3  PROT 7.3  ALBUMIN 3.8   No results found for this basename: LIPASE, AMYLASE,  in the last 168 hours No results found for this basename: AMMONIA,  in the last 168 hours CBC:  Recent Labs Lab 09/09/12 1803  WBC 12.8*  HGB 15.6*  HCT 43.4  MCV 89.1  PLT 428*   Cardiac Enzymes: No results found for this basename: CKTOTAL, CKMB, CKMBINDEX, TROPONINI,  in the last 168 hours  CBG: No results found for this basename: GLUCAP,  in the last 168 hours   Radiological Exams on Admission: Dg Chest Portable 1 View  09/09/2012  *RADIOLOGY REPORT*  Clinical Data: Hypotension, chest pain, shortness of breath, dizziness  PORTABLE CHEST - 1 VIEW  Comparison: 09/02/2012  Findings: Heart size upper limits normal for technique. There are low lung volumes with resultant crowding of bronchovascular structures.  No focal infiltrate.  No overt edema.  No effusion. Degenerative spurring in bilateral shoulders.  IMPRESSION:  1.  Low volumes.  No acute disease.   Original Report Authenticated By: D. Andria Rhein, MD     EKG: Independently reviewed. NSR no acute ST- T changes.   Assessment/Plan Present on Admission:  . Chest pain at rest . ARF (acute renal failure) . Hypotension arterial . Elevated troponin . CAD in native artery  PLAN:  Given her known  CAD with 2 stents, will continue her IV heparin started in the ED.  She will get troponin cycled, but it may be elevated because of renal insufficiency rather than ACS.  Cardiology has been consulted and will make further recommendation.  I think her hypotension and ARF is because of the ACE-I and volume depletion.  Will d/c Lisinopril for now, and give IVF.  Will follow cr carefully.  I also have stopped her Naprosyn.  She does have significant anxiety and will continue her psych meds.  She is stable, full code, and will be admitted to  [facility] service.  Thank you for allowing me to take part in the care of your nice patient.  Other plans as per orders.  Code Status: FULL Unk Lightning, MD. [facility] Pager [phone] 7pm to 7am.  09/09/2012, 9:42 PM

## 2012-09-09 NOTE — ED Notes (Signed)
ED EKG was completed at 1832; copy is placed in patient's chart.

## 2012-09-09 NOTE — ED Notes (Signed)
Pt reports she was discharged last week from hospital. States that she has felt gassy, with a constant hiccupping and discomfort in her chest radiating to back in between shoulder blades. Pt reports dizziness. Denies sob. Reports diaphoresis. Rates pain 9/10 to chest.

## 2012-09-09 NOTE — ED Notes (Signed)
Pt was at pcp for dizziness.  Dr's office called EMS for chest pain.  When EMS arrived, pt bp was 70/50.  EMS placed iv and started fluids.  Per pt, she has been having intermittent diarrhea x 3 months.  Pt ao x 4.

## 2012-09-10 ENCOUNTER — Inpatient Hospital Stay (HOSPITAL_COMMUNITY): Payer: Medicare Other

## 2012-09-10 ENCOUNTER — Encounter (HOSPITAL_COMMUNITY): Payer: Self-pay

## 2012-09-10 DIAGNOSIS — I200 Unstable angina: Secondary | ICD-10-CM

## 2012-09-10 DIAGNOSIS — G894 Chronic pain syndrome: Secondary | ICD-10-CM | POA: Diagnosis present

## 2012-09-10 DIAGNOSIS — I10 Essential (primary) hypertension: Secondary | ICD-10-CM | POA: Diagnosis present

## 2012-09-10 DIAGNOSIS — Z8673 Personal history of transient ischemic attack (TIA), and cerebral infarction without residual deficits: Secondary | ICD-10-CM

## 2012-09-10 DIAGNOSIS — N3020 Other chronic cystitis without hematuria: Secondary | ICD-10-CM | POA: Diagnosis present

## 2012-09-10 LAB — CBC
HCT: 40.7 % (ref 36.0–46.0)
Hemoglobin: 14.2 g/dL (ref 12.0–15.0)
MCH: 30.5 pg (ref 26.0–34.0)
MCHC: 34.9 g/dL (ref 30.0–36.0)
MCV: 87.3 fL (ref 78.0–100.0)

## 2012-09-10 LAB — BASIC METABOLIC PANEL
CO2: 25 mEq/L (ref 19–32)
Chloride: 101 mEq/L (ref 96–112)
Potassium: 2.9 mEq/L — ABNORMAL LOW (ref 3.5–5.1)
Sodium: 140 mEq/L (ref 135–145)

## 2012-09-10 LAB — POTASSIUM: Potassium: 4.8 mEq/L (ref 3.5–5.1)

## 2012-09-10 LAB — MAGNESIUM: Magnesium: 2.1 mg/dL (ref 1.5–2.5)

## 2012-09-10 LAB — HEPARIN LEVEL (UNFRACTIONATED): Heparin Unfractionated: 0.32 IU/mL (ref 0.30–0.70)

## 2012-09-10 LAB — TROPONIN I: Troponin I: 0.84 ng/mL (ref <0.30)

## 2012-09-10 MED ORDER — POTASSIUM CHLORIDE CRYS ER 20 MEQ PO TBCR
40.0000 meq | EXTENDED_RELEASE_TABLET | Freq: Two times a day (BID) | ORAL | Status: AC
  Administered 2012-09-10 (×2): 40 meq via ORAL
  Filled 2012-09-10 (×2): qty 2

## 2012-09-10 MED ORDER — MORPHINE SULFATE 2 MG/ML IJ SOLN
2.0000 mg | Freq: Once | INTRAMUSCULAR | Status: AC
  Administered 2012-09-10: 2 mg via INTRAVENOUS
  Filled 2012-09-10: qty 1

## 2012-09-10 MED ORDER — NALOXONE HCL 0.4 MG/ML IJ SOLN
0.4000 mg | Freq: Once | INTRAMUSCULAR | Status: AC
  Administered 2012-09-10: 0.4 mg via INTRAVENOUS

## 2012-09-10 MED ORDER — POTASSIUM CHLORIDE 10 MEQ/100ML IV SOLN
10.0000 meq | INTRAVENOUS | Status: AC
  Administered 2012-09-10 (×2): 10 meq via INTRAVENOUS
  Filled 2012-09-10 (×3): qty 100

## 2012-09-10 MED ORDER — NALOXONE HCL 0.4 MG/ML IJ SOLN
INTRAMUSCULAR | Status: AC
  Administered 2012-09-10: 17:00:00
  Filled 2012-09-10: qty 1

## 2012-09-10 MED ORDER — CLONAZEPAM 0.5 MG PO TABS
0.5000 mg | ORAL_TABLET | Freq: Two times a day (BID) | ORAL | Status: DC | PRN
  Administered 2012-09-12 – 2012-09-16 (×6): 0.5 mg via ORAL
  Filled 2012-09-10 (×6): qty 1

## 2012-09-10 NOTE — Progress Notes (Signed)
Pt lethargic early and unable to hold conversation without falling bacl asleep. Dr Blake Divine notied. Received Narcan 0.4 mg IV at 1644. Now more alert, oriented and conversant. Neurological intact.

## 2012-09-10 NOTE — Progress Notes (Signed)
Reason for Consult: Chest Pain  Requesting Physician: Claybon Jabs  HPI: This is a 62 y.o. female with a past medical history significant for CAD, S/P LAD stent in '05 with several caths since showing this site to be patient, th last one was in 2011. She was recently admitted to [facility] in [place] for chest pain. MI was ruled out. She had a negative stress echo. She was discharged 2/27 but presented to her primary care doctors office Fay[facility] F[facility] [place], [place]) 09/09/12 complaining of chest pain and was sent to [facility]. When I went in to examine her this afternoon she was markedly lethargic but finally awakened enough to answer "yes" to questions. I was unable to obtain any coherent HPI. She did say she had chest pain.  PMHx:  Past Medical History  Diagnosis Date  . CAD in native artery   . HTN (hypertension), benign   . Hyperlipidemia   . Depression   . Chronic back pain   . GERD (gastroesophageal reflux disease)   . Myocardial infarction   . Anginal pain   . Anxiety   . Asthma   . Pneumonia   . Peripheral vascular disease   . Neuromuscular disorder   . Headache   . Arthritis   . Fibromyalgia   . Anemia    Past Surgical History  Procedure Laterality Date  . Hernia repair    . Abdominal hysterectomy      FAMHx: Family History  Problem Relation Age of Onset  . CAD Mother   . CAD Brother     SOCHx:  reports that she has never smoked. She has never used smokeless tobacco. She reports that she does not drink alcohol or use illicit drugs. She says she lives alone, drives occasionaly  ALLERGIES: No Known Allergies  ROS: Review of systems not obtained due to patient factors.  HOME MEDICATIONS: Prescriptions prior to admission  Medication Sig Dispense Refill  . aspirin EC 81 MG tablet Take 81 mg by mouth daily.      Marland Kitchen buPROPion (WELLBUTRIN SR) 150 MG 12 hr tablet Take 150 mg by mouth daily.      . carvedilol (COREG) 3.125 MG tablet Take 3.125 mg by mouth 2 (two) times daily with a  meal.      . clonazePAM (KLONOPIN) 0.5 MG tablet Take 0.5 mg by mouth 2 (two) times daily.      . clorazepate (TRANXENE) 15 MG tablet Take 15 mg by mouth 2 (two) times daily.      Marland Kitchen gabapentin (NEURONTIN) 600 MG tablet Take 600 mg by mouth 3 (three) times daily.      Marland Kitchen lisinopril (PRINIVIL,ZESTRIL) 40 MG tablet Take 40 mg by mouth daily.      . naproxen (NAPROSYN) 500 MG tablet Take 500 mg by mouth 2 (two) times daily with a meal.      . nitroGLYCERIN (NITROSTAT) 0.4 MG SL tablet Place 0.4 mg under the tongue every 5 (five) minutes as needed for chest pain.      Marland Kitchen omeprazole (PRILOSEC) 40 MG capsule Take 40 mg by mouth 2 (two) times daily.      Marland Kitchen oxyCODONE (OXYCONTIN) 10 MG 12 hr tablet Take 10 mg by mouth every 6 (six) hours.      . pentosan polysulfate (ELMIRON) 100 MG capsule Take 100 mg by mouth daily.      . traMADol (ULTRAM) 50 MG tablet Take 50 mg by mouth every 6 (six) hours as needed for pain.  HOSPITAL MEDICATIONS: I have reviewed the patient's current medications.  VITALS: Blood pressure 110/64, pulse 76, temperature 97.8 F (36.6 C), temperature source Oral, resp. rate 18, height 5\' 4"  (1.626 m), weight 87.6 kg (193 lb 2 oz), SpO2 97.00%.  PHYSICAL EXAM: General appearance: morbidly obese and slowed mentation Neck: no carotid bruit and no JVD Lungs: decreased breath sounds, poor effort Heart: regular rate and rhythm Abdomen: obese Extremities: no edema Pulses: decreased distal pulses Skin: pale, cool and dry Neurologic:Pt initially unresponsive, snoring. She eventually responded to vigorous verbal stimulation and was able to answer a few questions      LABS: Results for orders placed during the hospital encounter of 09/09/12 (from the past 48 hour(s))  CBC     Status: Abnormal   Collection Time    09/09/12  6:03 PM      Result Value Range   WBC 12.8 (*) 4.0 - 10.5 K/uL   RBC 4.87  3.87 - 5.11 MIL/uL   Hemoglobin 15.6 (*) 12.0 - 15.0 g/dL   HCT 16.1  09.6 -  04.5 %   MCV 89.1  78.0 - 100.0 fL   MCH 32.0  26.0 - 34.0 pg   MCHC 35.9  30.0 - 36.0 g/dL   RDW 40.9  81.1 - 91.4 %   Platelets 428 (*) 150 - 400 K/uL  COMPREHENSIVE METABOLIC PANEL     Status: Abnormal   Collection Time    09/09/12  6:03 PM      Result Value Range   Sodium 136  135 - 145 mEq/L   Potassium 3.4 (*) 3.5 - 5.1 mEq/L   Chloride 97  96 - 112 mEq/L   CO2 26  19 - 32 mEq/L   Glucose, Bld 175 (*) 70 - 99 mg/dL   BUN 24 (*) 6 - 23 mg/dL   Creatinine, Ser 7.82 (*) 0.50 - 1.10 mg/dL   Calcium 9.7  8.4 - 95.6 mg/dL   Total Protein 7.3  6.0 - 8.3 g/dL   Albumin 3.8  3.5 - 5.2 g/dL   AST 27  0 - 37 U/L   ALT 23  0 - 35 U/L   Alkaline Phosphatase 129 (*) 39 - 117 U/L   Total Bilirubin 0.3  0.3 - 1.2 mg/dL   GFR calc non Af Amer 13 (*) >90 mL/min   GFR calc Af Amer 15 (*) >90 mL/min   Comment:            The eGFR has been calculated     using the CKD EPI equation.     This calculation has not been     validated in all clinical     situations.     eGFR's persistently     <90 mL/min signify     possible Chronic Kidney Disease.  POCT I-STAT TROPONIN I     Status: Abnormal   Collection Time    09/09/12  6:23 PM      Result Value Range   Troponin i, poc 0.71 (*) 0.00 - 0.08 ng/mL   Comment NOTIFIED PHYSICIAN     Comment 3            Comment: Due to the release kinetics of cTnI,     a negative result within the first hours     of the onset of symptoms does not rule out     myocardial infarction with certainty.     If myocardial infarction is still suspected,  repeat the test at appropriate intervals.  URINALYSIS, ROUTINE W REFLEX MICROSCOPIC     Status: Abnormal   Collection Time    09/09/12  7:34 PM      Result Value Range   Color, Urine AMBER (*) YELLOW   Comment: BIOCHEMICALS MAY BE AFFECTED BY COLOR   APPearance CLEAR  CLEAR   Specific Gravity, Urine 1.022  1.005 - 1.030   pH 5.0  5.0 - 8.0   Glucose, UA NEGATIVE  NEGATIVE mg/dL   Hgb urine dipstick  NEGATIVE  NEGATIVE   Bilirubin Urine SMALL (*) NEGATIVE   Ketones, ur 15 (*) NEGATIVE mg/dL   Protein, ur 161 (*) NEGATIVE mg/dL   Urobilinogen, UA 0.2  0.0 - 1.0 mg/dL   Nitrite NEGATIVE  NEGATIVE   Leukocytes, UA LARGE (*) NEGATIVE  URINE MICROSCOPIC-ADD ON     Status: Abnormal   Collection Time    09/09/12  7:34 PM      Result Value Range   Squamous Epithelial / LPF FEW (*) RARE   WBC, UA 11-20  <3 WBC/hpf   Bacteria, UA FEW (*) RARE   Casts GRANULAR CAST (*) NEGATIVE   Comment: HYALINE CASTS  TSH     Status: Abnormal   Collection Time    09/09/12 11:40 PM      Result Value Range   TSH 4.758 (*) 0.350 - 4.500 uIU/mL  TROPONIN I     Status: Abnormal   Collection Time    09/09/12 11:40 PM      Result Value Range   Troponin I 0.79 (*) <0.30 ng/mL   Comment:            Due to the release kinetics of cTnI,     a negative result within the first hours     of the onset of symptoms does not rule out     myocardial infarction with certainty.     If myocardial infarction is still suspected,     repeat the test at appropriate intervals.     CRITICAL RESULT CALLED TO, READ BACK BY AND VERIFIED WITH:     M.LEONARD,RN 0115 09/10/12 M.CAMPBELL  HEPARIN LEVEL (UNFRACTIONATED)     Status: None   Collection Time    09/10/12  4:05 AM      Result Value Range   Heparin Unfractionated 0.30  0.30 - 0.70 IU/mL   Comment:            IF HEPARIN RESULTS ARE BELOW     EXPECTED VALUES, AND PATIENT     DOSAGE HAS BEEN CONFIRMED,     SUGGEST FOLLOW UP TESTING     OF ANTITHROMBIN III LEVELS.  CBC     Status: Abnormal   Collection Time    09/10/12  4:05 AM      Result Value Range   WBC 12.4 (*) 4.0 - 10.5 K/uL   RBC 4.66  3.87 - 5.11 MIL/uL   Hemoglobin 14.2  12.0 - 15.0 g/dL   HCT 09.6  04.5 - 40.9 %   MCV 87.3  78.0 - 100.0 fL   MCH 30.5  26.0 - 34.0 pg   MCHC 34.9  30.0 - 36.0 g/dL   RDW 81.1  91.4 - 78.2 %   Platelets 385  150 - 400 K/uL  TROPONIN I     Status: Abnormal   Collection  Time    09/10/12  4:05 AM      Result Value Range   Troponin  I 0.76 (*) <0.30 ng/mL   Comment:            Due to the release kinetics of cTnI,     a negative result within the first hours     of the onset of symptoms does not rule out     myocardial infarction with certainty.     If myocardial infarction is still suspected,     repeat the test at appropriate intervals.     CRITICAL VALUE NOTED.  VALUE IS CONSISTENT WITH PREVIOUSLY REPORTED AND CALLED VALUE.  BASIC METABOLIC PANEL     Status: Abnormal   Collection Time    09/10/12  4:05 AM      Result Value Range   Sodium 140  135 - 145 mEq/L   Potassium 2.9 (*) 3.5 - 5.1 mEq/L   Chloride 101  96 - 112 mEq/L   CO2 25  19 - 32 mEq/L   Glucose, Bld 120 (*) 70 - 99 mg/dL   BUN 26 (*) 6 - 23 mg/dL   Creatinine, Ser 1.61 (*) 0.50 - 1.10 mg/dL   Calcium 9.3  8.4 - 09.6 mg/dL   GFR calc non Af Amer 15 (*) >90 mL/min   GFR calc Af Amer 18 (*) >90 mL/min   Comment:            The eGFR has been calculated     using the CKD EPI equation.     This calculation has not been     validated in all clinical     situations.     eGFR's persistently     <90 mL/min signify     possible Chronic Kidney Disease.  TROPONIN I     Status: Abnormal   Collection Time    09/10/12  9:27 AM      Result Value Range   Troponin I 0.84 (*) <0.30 ng/mL   Comment:            Due to the release kinetics of cTnI,     a negative result within the first hours     of the onset of symptoms does not rule out     myocardial infarction with certainty.     If myocardial infarction is still suspected,     repeat the test at appropriate intervals.     CRITICAL VALUE NOTED.  VALUE IS CONSISTENT WITH PREVIOUSLY REPORTED AND CALLED VALUE.    IMAGING: Dg Chest Portable 1 View  09/09/2012  *RADIOLOGY REPORT*  Clinical Data: Hypotension, chest pain, shortness of breath, dizziness  PORTABLE CHEST - 1 VIEW  Comparison: 09/02/2012  Findings: Heart size upper limits normal for  technique. There are low lung volumes with resultant crowding of bronchovascular structures.  No focal infiltrate.  No overt edema.  No effusion. Degenerative spurring in bilateral shoulders.  IMPRESSION:  1.  Low volumes.  No acute disease.   Original Report Authenticated By: D. Andria Rhein, MD     IMPRESSION: Principal Problem:   Unstable angina Active Problems:   Elevated troponin   CAD, LAD DES '05, four caths since have shown patent LAD, last cath 11/11   ARF (acute renal failure)   Hypotension arterial   Chronic pain syndrome   HTN (hypertension), on muiltiple anti hypertensives prior to admission   Chronic cystitis   History of CVA , Lt brain   RECOMMENDATION: I have contacted primary to service to re evaluate her pain medications. Her Troponin is positive but acute renal  injury is new compared with 8/12 when SCr was 1.05. She just had a functional study 09/02/12 that was negative for significant ischemia. Doubt she is a candidate for cath at this time. MD to see.  Time Spent Directly with Patient: 50 minutes  KILROY,LUKE K 09/10/2012, 3:22 PM   I have seen and examined the patient along with Abelino Derrick, PA.  I have reviewed the chart, notes and new data.  I agree with PA's note.  She is very sleepy and the history is mostly from the chart. Cardiovascular exam is normal. ECG is completely normal. The elevation in cardiac Trop I is in a "plateau" pattern, more suggestive of nonischemic injury, probably related to acute renal failure. Numerous coronary angio procedures over last few years, performed for chest pain, have not shown a problem. Very recent stress echo was reported as normal. I do not think her symptoms are cardiac and do not see a reason to proceed with invasive evaluation. On the contrary, angiography would be deleterious in the setting of acute renal failure.  Thurmon Fair, MD, [facility] [phone] 09/10/2012, 4:33 PM

## 2012-09-10 NOTE — Progress Notes (Signed)
Pt output over 5 hours. Pt on 176ml/hr NS continuous fluid. Pt bladder scanned with 61ml post-void residual. Pt states that bladder feels empty. MD notified. Will continue to monitor. Baron Hamper RN 09/10/2012 4:54 AM

## 2012-09-10 NOTE — Progress Notes (Signed)
CRITICAL VALUE ALERT  Critical value received:  Troponin 0.76  Date of notification:  09/10/2012  Time of notification:  01:15  Critical value read back:yes  Nurse who received alert:  Jobe Igo, RN  MD notified (1st page):  [place]  Time of first page:  01:20  MD notified (2nd page):  Time of second page:  Responding MD:  [place]ist [place]  Time MD responded:  01:40

## 2012-09-10 NOTE — Progress Notes (Signed)
Brief Nutrition Note:  RD pulled to pt for malnutrition screening tool. Pt sleeping soundly x3. Wakes to name call but quickly returns to sleep. Does not answer any questions. RD will attempt again tomorrow.   Clarene Duke RD, LDN Pager [phone] After Hours pager [phone]

## 2012-09-10 NOTE — Progress Notes (Signed)
Pt having pain in back and chest 10/10. Pt already given scheduled oxycontin 10mg  and tramadol earlier. No other pain medicine available to give. MD notified and new orders for morphine given. WiIll continue to monitor. Jobe Igo A RN 09/10/2012 1:50 AM

## 2012-09-10 NOTE — Progress Notes (Signed)
[facility] PROGRESS NOTE  [name] YQM:[id number] DOB: 05-26-1951 DOA: 09/09/2012 PCP: No primary provider on file.  Assessment/Plan: 1. Atypical chest pain: resolved now.  - elevated troponins possibly reflect new renal insufficiency rather than an ischemic event.  - she had a recent stress test negative for inducible ischemia.  - her EKG does not show any ischemic changes.  - cardiology consultation from [facility] requested.  - resume IV heparin and aspirin/ lipitor/ coreg.   2. Acute on CKD : her baseline creatinine is 1.05 in 2012. Probably pre renal in view of her hypotension and secondary to ACE I.   Her creatitine on admission is 3.44 and has improved to 3.10. We will obtain renal ultrasound to evaluation for any obstructive causes. Get urinary sodium and creatinine.  I/O last 3 completed shifts: In: 486.5 [I.V.:486.5] Out: 186 [Urine:186] Total I/O In: 240 [P.O.:240] Out: -    3. Hypokalemia: replete as needed. willget Mag levels and repeat potassium tonight.   4. CAD S/P stents: now ches tpain free . Resume home medications.   5. Anxiety: klonipin prn . Hold for sedation.   DVT prophylaxis.    Code Status: full code Family Communication: none at bedside Disposition Plan: pending.    Consultants:  cardiology  HPI/Subjective: She reports she is exhausted. No chest pain, or sob, she wants to sleep.   Objective: Filed Vitals:   09/10/12 0122 09/10/12 0459 09/10/12 0557 09/10/12 1324  BP: 105/69 109/61 115/69 110/64  Pulse: 75 74  76  Temp:  97.8 F (36.6 C)  97.8 F (36.6 C)  TempSrc:  Oral  Oral  Resp: 20 20  18   Height:      Weight:  87.6 kg (193 lb 2 oz)    SpO2: 95% 95%  97%    Intake/Output Summary (Last 24 hours) at 09/10/12 1532 Last data filed at 09/10/12 1323  Gross per 24 hour  Intake 726.45 ml  Output    186 ml  Net 540.45 ml   Filed Weights   09/09/12 2323 09/10/12 0459  Weight: 87.7 kg (193 lb 5.5 oz) 87.6 kg (193 lb 2 oz)     Exam: Alert afebrile comfortable CHEST: Normal respiration, clear to auscultation bilaterally.  HEART: Regular rate and rhythm. There are no murmur, rub, or gallops.  BACK: No kyphosis or scoliosis; no CVA tenderness  ABDOMEN: soft and non-tender; no masses, no organomegaly, normal abdominal bowel sounds; no pannus; no intertriginous candida. There is no rebound and no distention.  EXTREMITIES: No bone or joint deformity; age-appropriate arthropathy of the hands and knees; no edema; no ulcerations. There is no calf tenderness   Data Reviewed: Basic Metabolic Panel:  Recent Labs Lab 09/09/12 1803 09/10/12 0405  NA 136 140  K 3.4* 2.9*  CL 97 101  CO2 26 25  GLUCOSE 175* 120*  BUN 24* 26*  CREATININE 3.44* 3.10*  CALCIUM 9.7 9.3   Liver Function Tests:  Recent Labs Lab 09/09/12 1803  AST 27  ALT 23  ALKPHOS 129*  BILITOT 0.3  PROT 7.3  ALBUMIN 3.8   No results found for this basename: LIPASE, AMYLASE,  in the last 168 hours No results found for this basename: AMMONIA,  in the last 168 hours CBC:  Recent Labs Lab 09/09/12 1803 09/10/12 0405  WBC 12.8* 12.4*  HGB 15.6* 14.2  HCT 43.4 40.7  MCV 89.1 87.3  PLT 428* 385   Cardiac Enzymes:  Recent Labs Lab 09/09/12 2340 09/10/12 0405  09/10/12 0927  TROPONINI 0.79* 0.76* 0.84*   BNP (last 3 results) No results found for this basename: PROBNP,  in the last 8760 hours CBG: No results found for this basename: GLUCAP,  in the last 168 hours  No results found for this or any previous visit (from the past 240 hour(s)).   Studies: Dg Chest Portable 1 View  09/09/2012  *RADIOLOGY REPORT*  Clinical Data: Hypotension, chest pain, shortness of breath, dizziness  PORTABLE CHEST - 1 VIEW  Comparison: 09/02/2012  Findings: Heart size upper limits normal for technique. There are low lung volumes with resultant crowding of bronchovascular structures.  No focal infiltrate.  No overt edema.  No effusion. Degenerative  spurring in bilateral shoulders.  IMPRESSION:  1.  Low volumes.  No acute disease.   Original Report Authenticated By: D. Andria Rhein, MD     Scheduled Meds: . aspirin EC  81 mg Oral Daily  . atorvastatin  20 mg Oral q1800  . buPROPion  150 mg Oral Daily  . carvedilol  3.125 mg Oral BID WC  . clonazePAM  0.5 mg Oral BID  . docusate sodium  100 mg Oral BID  . gabapentin  600 mg Oral TID  . pantoprazole  40 mg Oral Daily  . pentosan polysulfate  100 mg Oral Daily  . potassium chloride  40 mEq Oral BID  . sodium chloride  3 mL Intravenous Q12H   Continuous Infusions: . sodium chloride 100 mL/hr at 09/10/12 0703  . heparin 1,000 Units/hr (09/10/12 1610)    Principal Problem:   Unstable angina Active Problems:   ARF (acute renal failure)   Hypotension arterial   Elevated troponin   CAD, LAD DES '05, four caths since have shown patent LAD, last cath 11/11   HTN (hypertension), on muiltiple anti hypertensives prior to admission   Chronic pain syndrome   Chronic cystitis   History of CVA , Lt brain    Time spent: 25 min    [facility]  [facility] Pager [phone]. If 7PM-7AM, please contact night-coverage at [email or web address], password [facility] 09/10/2012, 3:32 PM  LOS: 1 day

## 2012-09-10 NOTE — Progress Notes (Signed)
ANTICOAGULATION CONSULT NOTE - Follow Up Consult  Pharmacy Consult for Heparin Indication: elevated troponins  No Known Allergies  Patient Measurements: Height: 5\' 4"  (162.6 cm) Weight: 193 lb 2 oz (87.6 kg) IBW/kg (Calculated) : 54.7 Heparin Dosing Weight: 74 kg  Vital Signs: Temp: 97.9 F (36.6 C) (03/06 1614) Temp src: Oral (03/06 1614) BP: 101/63 mmHg (03/06 1807) Pulse Rate: 53 (03/06 1807)  Labs:  Recent Labs  09/09/12 1803 09/09/12 2340 09/10/12 0405 09/10/12 0927 09/10/12 1615  HGB 15.6*  --  14.2  --   --   HCT 43.4  --  40.7  --   --   PLT 428*  --  385  --   --   HEPARINUNFRC  --   --  0.30  --  0.32  CREATININE 3.44*  --  3.10*  --   --   TROPONINI  --  0.79* 0.76* 0.84*  --     Estimated Creatinine Clearance: 20.4 ml/min (by C-G formula based on Cr of 3.1).   Medications:  Scheduled:  . [COMPLETED] aspirin  324 mg Oral Once  . aspirin EC  81 mg Oral Daily  . atorvastatin  20 mg Oral q1800  . buPROPion  150 mg Oral Daily  . carvedilol  3.125 mg Oral BID WC  . docusate sodium  100 mg Oral BID  . gabapentin  600 mg Oral TID  . [COMPLETED] heparin  4,000 Units Intravenous Once  . [COMPLETED]  morphine injection  2 mg Intravenous Once  . [COMPLETED] naloxone      . [COMPLETED] naLOXone (NARCAN)  injection  0.4 mg Intravenous Once  . pantoprazole  40 mg Oral Daily  . pentosan polysulfate  100 mg Oral Daily  . [COMPLETED] potassium chloride  10 mEq Intravenous Q1 Hr x 2  . potassium chloride  40 mEq Oral BID  . sodium chloride  3 mL Intravenous Q12H  . [DISCONTINUED] clonazePAM  0.5 mg Oral BID  . [DISCONTINUED] clorazepate  15 mg Oral BID  . [DISCONTINUED] OxyCODONE  10 mg Oral Q6H   Infusions:  . sodium chloride 100 mL/hr (09/10/12 1757)  . heparin 1,000 Units/hr (09/10/12 6578)    Assessment: 62 yo F with hx CAD started on heparin for chest pain with elevated troponin.  Cardiology was consulted and suggested troponin elevation secondary to  ARF as opposed to cardiac indications.  Pt has history of multiple negative cardiac evals - most recently in Feb 2014.  Pt continues on heparin at 1000 units/hr.  Heparin level is at low end of goal range.  Will not adjust heparin rate at this time since CP and troponin elevation thought to be noncardiac in nature.  Goal of Therapy:  Heparin level 0.3-0.7 units/ml   Plan:  Continue heparin at 1000 units/hr. D/C heparin if no further cardiac work-up planned? Follow up renal function and adjust other medication appropriately.  [employer], Pharm.D., BCPS Clinical Pharmacist Pager [phone] 09/10/2012 6:25 PM

## 2012-09-10 NOTE — Progress Notes (Signed)
ANTICOAGULATION CONSULT NOTE - Follow Up Consult  Pharmacy Consult for heparin Indication: chest pain/ACS  Labs:  Recent Labs  09/09/12 1803 09/09/12 2340 09/10/12 0405  HGB 15.6*  --  14.2  HCT 43.4  --  40.7  PLT 428*  --  385  HEPARINUNFRC  --   --  0.30  CREATININE 3.44*  --  3.10*  TROPONINI  --  0.79* 0.76*    Assessment: 61yo female therapeutic on heparin with initial dosing for ACS though on the very low end of goal.  Goal of Therapy:  Heparin level 0.3-0.7 units/ml   Plan:  Will increase heparin gtt by 1 unit/kg/hr to 1000 units/hr and check level in 8hr.  Vernard Gambles, PharmD, BCPS  09/10/2012,6:40 AM

## 2012-09-10 NOTE — Progress Notes (Signed)
Utilization Review Completed.   Kimberly Tucker, RN, BSN Nurse Case Manager  [phone]  

## 2012-09-11 DIAGNOSIS — R079 Chest pain, unspecified: Secondary | ICD-10-CM

## 2012-09-11 DIAGNOSIS — I10 Essential (primary) hypertension: Secondary | ICD-10-CM

## 2012-09-11 LAB — CBC
Hemoglobin: 12.4 g/dL (ref 12.0–15.0)
MCH: 30.7 pg (ref 26.0–34.0)
MCHC: 33.1 g/dL (ref 30.0–36.0)
Platelets: 353 10*3/uL (ref 150–400)
RDW: 14 % (ref 11.5–15.5)

## 2012-09-11 LAB — BASIC METABOLIC PANEL
Calcium: 9.1 mg/dL (ref 8.4–10.5)
GFR calc Af Amer: 30 mL/min — ABNORMAL LOW (ref >90)
GFR calc non Af Amer: 26 mL/min — ABNORMAL LOW (ref >90)
Glucose, Bld: 116 mg/dL — ABNORMAL HIGH (ref 70–99)
Sodium: 139 mEq/L (ref 135–145)

## 2012-09-11 LAB — URINE CULTURE: Colony Count: 60000

## 2012-09-11 LAB — SODIUM, URINE, RANDOM: Sodium, Ur: 50 mEq/L

## 2012-09-11 LAB — CREATININE, URINE, RANDOM: Creatinine, Urine: 84.54 mg/dL

## 2012-09-11 MED ORDER — NITROGLYCERIN 0.4 MG SL SUBL
0.4000 mg | SUBLINGUAL_TABLET | SUBLINGUAL | Status: DC | PRN
  Administered 2012-09-12 – 2012-09-16 (×5): 0.4 mg via SUBLINGUAL
  Filled 2012-09-11 (×4): qty 25

## 2012-09-11 MED ORDER — LOPERAMIDE HCL 2 MG PO CAPS
2.0000 mg | ORAL_CAPSULE | ORAL | Status: DC | PRN
  Administered 2012-09-11: 2 mg via ORAL
  Filled 2012-09-11: qty 1

## 2012-09-11 MED ORDER — MORPHINE SULFATE 2 MG/ML IJ SOLN
1.0000 mg | Freq: Once | INTRAMUSCULAR | Status: AC
  Administered 2012-09-11: 1 mg via INTRAVENOUS
  Filled 2012-09-11: qty 1

## 2012-09-11 MED ORDER — GABAPENTIN 600 MG PO TABS
300.0000 mg | ORAL_TABLET | Freq: Two times a day (BID) | ORAL | Status: DC
  Filled 2012-09-11 (×3): qty 0.5

## 2012-09-11 MED ORDER — CEFTRIAXONE SODIUM 1 G IJ SOLR
1.0000 g | INTRAMUSCULAR | Status: DC
  Administered 2012-09-11 – 2012-09-12 (×2): 1 g via INTRAVENOUS
  Filled 2012-09-11 (×3): qty 10

## 2012-09-11 MED ORDER — NITROGLYCERIN 0.4 MG SL SUBL
SUBLINGUAL_TABLET | SUBLINGUAL | Status: AC
  Administered 2012-09-11: 23:00:00
  Filled 2012-09-11: qty 25

## 2012-09-11 MED ORDER — NITROGLYCERIN 0.3 MG SL SUBL
0.3000 mg | SUBLINGUAL_TABLET | SUBLINGUAL | Status: DC | PRN
  Filled 2012-09-11: qty 100

## 2012-09-11 NOTE — Progress Notes (Signed)
Pt complaining of chest pain. EKG. VSS. MD notified and ordered to give Nitro. Pt still having chest pain and generalized pain. MD notified and 1mg  morphine IV ordered. Pt given morphine along with tramadol per pt request. Will continue to monitor and assess. Baron Hamper RN 09/11/2012 11:27 PM

## 2012-09-11 NOTE — Progress Notes (Signed)
[facility]  Subjective: Still complaining of left sided CP which is 8/10 with palpation   Objective: Vital signs in last 24 hours: Temp:  [97.7 F (36.5 C)-98.3 F (36.8 C)] 97.7 F (36.5 C) (03/07 0529) Pulse Rate:  [53-97] 97 (03/07 0529) Resp:  [18-19] 19 (03/07 0529) BP: (96-112)/(49-67) 110/64 mmHg (03/07 0637) SpO2:  [92 %-100 %] 100 % (03/07 0529) Weight:  [90 kg (198 lb 6.6 oz)] 90 kg (198 lb 6.6 oz) (03/07 0529) Last BM Date: 09/11/12  Intake/Output from previous day: 03/06 0701 - 03/07 0700 In: 2590 [P.O.:960; I.V.:1430; IV Piggyback:200] Out: 725 [Urine:725] Intake/Output this shift: Total I/O In: 240 [P.O.:240] Out: 300 [Urine:300]  Medications Current Facility-Administered Medications  Medication Dose Route Frequency Provider Last Rate Last Dose  . 0.9 %  sodium chloride infusion   Intravenous Continuous Houston Siren, MD 100 mL/hr at 09/11/12 0405    . aspirin EC tablet 81 mg  81 mg Oral Daily Houston Siren, MD   81 mg at 09/11/12 0934  . atorvastatin (LIPITOR) tablet 20 mg  20 mg Oral q1800 Houston Siren, MD   20 mg at 09/10/12 1809  . buPROPion [facility] SR) 12 hr tablet 150 mg  150 mg Oral Daily Houston Siren, MD   150 mg at 09/11/12 0934  . carvedilol (COREG) tablet 3.125 mg  3.125 mg Oral BID WC Houston Siren, MD   3.125 mg at 09/11/12 1914  . cefTRIAXone (ROCEPHIN) 1 g in dextrose 5 % 50 mL IVPB  1 g Intravenous Q24H Kathlen Mody, MD   1 g at 09/11/12 1045  . clonazePAM (KLONOPIN) tablet 0.5 mg  0.5 mg Oral BID PRN Kathlen Mody, MD      . docusate sodium (COLACE) capsule 100 mg  100 mg Oral BID Houston Siren, MD   100 mg at 09/10/12 2113  . gabapentin (NEURONTIN) tablet 600 mg  600 mg Oral TID Houston Siren, MD   600 mg at 09/11/12 0935  . ondansetron (ZOFRAN) tablet 4 mg  4 mg Oral Q6H PRN Houston Siren, MD       Or  . ondansetron [facility]) injection 4 mg  4 mg Intravenous Q6H PRN Houston Siren, MD      . pantoprazole (PROTONIX) EC tablet 40 mg  40 mg Oral Daily  Houston Siren, MD   40 mg at 09/11/12 0935  . pentosan polysulfate (ELMIRON) capsule 100 mg  100 mg Oral Daily Houston Siren, MD   100 mg at 09/11/12 0935  . sodium chloride 0.9 % injection 3 mL  3 mL Intravenous Q12H Houston Siren, MD   3 mL at 09/11/12 1000  . traMADol (ULTRAM) tablet 50 mg  50 mg Oral Q6H PRN Houston Siren, MD   50 mg at 09/09/12 2250    PE: General appearance: alert, cooperative and no distress Lungs: clear to auscultation bilaterally Heart: regular rate and rhythm and 1/6 sys MM Chest wall:  Very tender to palpation left of the sternum and inferior to the left clavicle.  Extremities: No LEE Pulses: 2+ and symmetric Skin: Warm and dry Neurologic: Grossly normal  Lab Results:   Recent Labs  09/09/12 1803 09/10/12 0405 09/11/12 0555  WBC 12.8* 12.4* 17.9*  HGB 15.6* 14.2 12.4  HCT 43.4 40.7 37.5  PLT 428* 385 353   BMET  Recent Labs  09/09/12 1803 09/10/12 0405 09/10/12 1615 09/11/12 0555  NA 136 140  --  139  K 3.4* 2.9* 4.8 4.5  CL 97 101  --  104  CO2 26 25  --  22  GLUCOSE 175* 120*  --  116*  BUN 24* 26*  --  21  CREATININE 3.44* 3.10*  --  2.05*  CALCIUM 9.7 9.3  --  9.1   Cardiac Panel (last 3 results)  Recent Labs  09/09/12 2340 09/10/12 0405 09/10/12 0927  TROPONINI 0.79* 0.76* 0.84*    Assessment/Plan  Principal Problem:   Unstable angina Active Problems:   ARF (acute renal failure)   Hypotension arterial   Elevated troponin   CAD, LAD DES '05, four caths since have shown patent LAD, last cath 11/11   HTN (hypertension), on muiltiple anti hypertensives prior to admission   Chronic pain syndrome   Chronic cystitis   History of CVA , Lt brain  Plan:  Current CP is atypical.  Very tender to palpation.  Improving SCr.   BP and HR stable.  NSR on telemetry.      LOS: 2 days    HAGER, BRYAN 09/11/2012 11:38 AM  I have seen and evaluated the patient this PM along with Wilburt Finlay, PA. I agree with his findings, examination as well as  impression recommendations.  Atypical CP syndrome - not likely cardiac in nature - painful on palpation. I agree with Dr. Erin Hearing initial note from yesterday.   In light of renal failure, would not pursue invasive evaluation with coronary angiography.   Will defer care to [facility].  We will be available if symptoms change or if she were to become unstable from a cardiac standpoint.  Otherwise, we will sign off for now.   Marykay Lex, M.D., M.S. [facility] [address] [place], [place]  [address]  [phone] Pager # [phone] 09/11/2012 1:48 PM

## 2012-09-11 NOTE — Evaluation (Signed)
Physical Therapy Evaluation Patient Details Name: [name] MRN: [id number] DOB: 03/01/1951 Today's Date: 09/11/2012 Time: 6213-0865 PT Time Calculation (min): 39 min  PT Assessment / Plan / Recommendation Clinical Impression  62 yo adm with chest pain with troponin elevated, however cardiology does not feel this is cardiac related. Per cardiology, this is due to new renal insufficiency. Pt reports a h/o falls since May 2013. She reports she can go stay with her son (what he wants her to do, per pt), however he works 2 jobs and she will be alone a majority of the time.    PT Assessment  Patient needs continued PT services    Follow Up Recommendations  SNF;Supervision/Assistance - 24 hour    Does the patient have the potential to tolerate intense rehabilitation      Barriers to Discharge Decreased caregiver support      Equipment Recommendations  Rolling walker with 5" wheels    Recommendations for Other Services OT consult   Frequency Min 3X/week    Precautions / Restrictions Precautions Precautions: Fall   Pertinent Vitals/Pain Reported dizziness on initial sitting from supine      Mobility  Bed Mobility Bed Mobility: Rolling Left;Left Sidelying to Sit;Sitting - Scoot to [place] of Bed;Sit to Supine;Scooting to [facility] Rolling Left: 5: Supervision;With rail Left Sidelying to Sit: 3: Mod assist;With rails;HOB flat Sitting - Scoot to Edge of Bed: 4: Min assist Sit to Supine: 4: Min assist Scooting to HOB: 4: Min assist;Other (comment) (trendelenburg) Details for Bed Mobility Assistance: pt impulsive and moving to EOB despite instuctions to wait Transfers Transfers: Sit to Stand;Stand to Sit;Stand Pivot Transfers Sit to Stand: 4: Min assist;With upper extremity assist;From bed;From chair/3-in-1 Stand to Sit: 3: Mod assist;To bed;To chair/3-in-1 Stand Pivot Transfers: 3: Mod assist Details for Transfer Assistance: Pt impulsive and does not attend to IV line as she moves. Pt  leans to her Rt as she fatigues, does not fully pivot and requires assist to complete turning prior to sitting Ambulation/Gait Ambulation/Gait Assistance: Not tested (comment)    Exercises     PT Diagnosis: Difficulty walking;Generalized weakness  PT Problem List: Decreased strength;Decreased activity tolerance;Decreased balance;Decreased mobility;Decreased knowledge of use of DME;Decreased safety awareness;Decreased knowledge of precautions;Obesity PT Treatment Interventions: DME instruction;Gait training;Functional mobility training;Therapeutic activities;Therapeutic exercise;Balance training;Patient/family education   PT Goals Acute Rehab PT Goals PT Goal Formulation: With patient Time For Goal Achievement: 09/18/12 Potential to Achieve Goals: Good Pt will go Supine/Side to Sit: with supervision;with HOB 0 degrees PT Goal: Supine/Side to Sit - Progress: Goal set today Pt will go Sit to Supine/Side: with modified independence;with HOB 0 degrees PT Goal: Sit to Supine/Side - Progress: Goal set today Pt will go Sit to Stand: with supervision PT Goal: Sit to Stand - Progress: Goal set today Pt will Transfer Bed to Chair/Chair to Bed: with supervision PT Transfer Goal: Bed to Chair/Chair to Bed - Progress: Goal set today Pt will Ambulate: 51 - 150 feet;with min assist;with least restrictive assistive device PT Goal: Ambulate - Progress: Goal set today  Visit Information  Last PT Received On: 09/11/12 Assistance Needed: +2 (to amb)    Subjective Data  Subjective: "I don't have anyone who can stay with me all the time" Patient Stated Goal: stop falling   Prior Functioning  Home Living Lives With: Alone Available Help at Discharge: Family (can stay with son; he works ) Type of Home: Apartment (son's apartment) Home Access: Stairs to enter [employer] of Steps: 21 (  second floor apt) Entrance Stairs-Rails: Right Home Layout: One level Bathroom Accessibility: Yes How  Accessible: Accessible via walker Home Adaptive Equipment: None Prior Function Level of Independence: Independent Able to Take Stairs?: Yes Driving: Yes Vocation: On disability Comments: reports holding onto her walls due to dizziness; reports several falls since May 2013 (bends over and as coming up gets too dizzy and falls forwards) Communication Communication: Expressive difficulties (word finding problems/substituting (?due to lethargy)    Cognition  Cognition Overall Cognitive Status: Impaired Area of Impairment: Attention;Safety/judgement;Awareness of errors Arousal/Alertness: Lethargic Orientation Level: Oriented X4 / Intact Behavior During Session: Lethargic Current Attention Level: Sustained Attention - Other Comments: easily distracted (mostly internally) Safety/Judgement: Decreased safety judgement for tasks assessed;Impulsive;Decreased awareness of need for assistance    Extremity/Trunk Assessment Right Lower Extremity Assessment RLE ROM/Strength/Tone: Deficits RLE ROM/Strength/Tone Deficits: AROM WFL; grossly 4/5 Left Lower Extremity Assessment LLE ROM/Strength/Tone: Deficits LLE ROM/Strength/Tone Deficits: AROM WFL; grossly 4/5   Balance Balance Balance Assessed: Yes Static Sitting Balance Static Sitting - Balance Support: Left upper extremity supported;Feet supported Static Sitting - Level of Assistance: 4: Min assist Static Sitting - Comment/# of Minutes: leaning to her Lt; eventually progressed to holding herself in midline Dynamic Sitting Balance Dynamic Sitting - Balance Support: Left upper extremity supported;Feet supported Dynamic Sitting - Level of Assistance: 4: Min assist Dynamic Sitting Balance - Compensations: reaching forward for toilet tissue across from her Static Standing Balance Static Standing - Balance Support: Left upper extremity supported;During functional activity Static Standing - Level of Assistance: 3: Mod assist Static Standing -  Comment/# of Minutes: pt fatigued while on BSC and required incr assist due to leaning to her Rt  End of Session PT - End of Session Equipment Utilized During Treatment: Gait belt Activity Tolerance: Patient limited by fatigue Patient left: in bed;with call bell/phone within reach;with bed alarm set Nurse Communication: Mobility status  GP     SASSER,LYNN 09/11/2012, 1:55 PM Pager [phone]

## 2012-09-11 NOTE — Progress Notes (Signed)
Pt c/o diarrhea, sample sent, negative for cdif, imodium ordered

## 2012-09-11 NOTE — Progress Notes (Signed)
09/11/12 1415 Noted new CM order to obtain PCP for pt.  Pt. has a PCP Amelia P. Wilson.  PT recommends SNF.  Will make CSW Lupita Leash aware. Tera Mater, RN, BSN NCM [phone]

## 2012-09-11 NOTE — Progress Notes (Signed)
[facility] PROGRESS NOTE  [name]:[id number] DOB: 1951-01-19 DOA: 09/09/2012 PCP: No primary provider on file.  Assessment/Plan: 1. Atypical chest pain: resolved now.  - elevated troponins possibly reflect new renal insufficiency rather than an ischemic event.  - she had a recent stress test negative for inducible ischemia.  - her EKG does not show any ischemic changes.  - cardiology consultation from [facility] requestedn recommended no further intervention. - discontinued the heparin. Resume aspirin/lipitor and coreg.    2. Acute on CKD stage 2: improving.  her baseline creatinine is 1.05 in 2012. Probably pre renal in view of her hypotension and secondary to ACE I.   Her creatitine on admission is 3.44 and has improved to 2.05. Renal ultrasound negative for any obstructive causes.  I/O last 3 completed shifts: In: 3076.5 [P.O.:960; I.V.:1916.5; IV Piggyback:200] Out: 911 [Urine:911] Total I/O In: 240 [P.O.:240] Out: -    3. Hypokalemia: replete as needed. Magnesium levels within normal limits.   4. CAD S/P stents: now chest pain free . Resume home medications.   5. Anxiety: klonipin prn . Hold for sedation.   DVT prophylaxis.    Code Status: full code Family Communication: none at bedside Disposition Plan: pending.    Consultants:  cardiology  HPI/Subjective:  unsteady on her feet. PT/OT evaluation ordered.  Objective: Filed Vitals:   09/10/12 1807 09/10/12 2153 09/11/12 0529 09/11/12 0637  BP: 101/63 112/66 100/49 110/64  Pulse: 53 66 97   Temp:  98.3 F (36.8 C) 97.7 F (36.5 C)   TempSrc:  Oral Oral   Resp:  18 19   Height:      Weight:   90 kg (198 lb 6.6 oz)   SpO2:  99% 100%     Intake/Output Summary (Last 24 hours) at 09/11/12 0935 Last data filed at 09/11/12 0800  Gross per 24 hour  Intake   2590 ml  Output    725 ml  Net   1865 ml   Filed Weights   09/09/12 2323 09/10/12 0459 09/11/12 0529  Weight: 87.7 kg (193 lb 5.5 oz) 87.6  kg (193 lb 2 oz) 90 kg (198 lb 6.6 oz)    Exam: Alert afebrile comfortable CHEST: Normal respiration, clear to auscultation bilaterally.  HEART: Regular rate and rhythm. There are no murmur, rub, or gallops.  BACK: No kyphosis or scoliosis; no CVA tenderness  ABDOMEN: soft and non-tender; no masses, no organomegaly, normal abdominal bowel sounds; no pannus; no intertriginous candida. There is no rebound and no distention.  EXTREMITIES: No bone or joint deformity; age-appropriate arthropathy of the hands and knees; no edema; no ulcerations. There is no calf tenderness   Data Reviewed: Basic Metabolic Panel:  Recent Labs Lab 09/09/12 1803 09/10/12 0405 09/10/12 1615 09/11/12 0555  NA 136 140  --  139  K 3.4* 2.9* 4.8 4.5  CL 97 101  --  104  CO2 26 25  --  22  GLUCOSE 175* 120*  --  116*  BUN 24* 26*  --  21  CREATININE 3.44* 3.10*  --  2.05*  CALCIUM 9.7 9.3  --  9.1  MG  --   --  2.1  --    Liver Function Tests:  Recent Labs Lab 09/09/12 1803  AST 27  ALT 23  ALKPHOS 129*  BILITOT 0.3  PROT 7.3  ALBUMIN 3.8   No results found for this basename: LIPASE, AMYLASE,  in the last 168 hours No results found for  this basename: AMMONIA,  in the last 168 hours CBC:  Recent Labs Lab 09/09/12 1803 09/10/12 0405 09/11/12 0555  WBC 12.8* 12.4* 17.9*  HGB 15.6* 14.2 12.4  HCT 43.4 40.7 37.5  MCV 89.1 87.3 92.8  PLT 428* 385 353   Cardiac Enzymes:  Recent Labs Lab 09/09/12 2340 09/10/12 0405 09/10/12 0927  TROPONINI 0.79* 0.76* 0.84*   BNP (last 3 results) No results found for this basename: PROBNP,  in the last 8760 hours CBG: No results found for this basename: GLUCAP,  in the last 168 hours  Recent Results (from the past 240 hour(s))  URINE CULTURE     Status: None   Collection Time    09/09/12  7:34 PM      Result Value Range Status   Specimen Description URINE, CLEAN CATCH   Final   Special Requests NONE   Final   Culture  Setup Time 09/10/2012  02:51   Final   Colony Count 60,000 COLONIES/ML   Final   Culture     Final   Value: Multiple bacterial morphotypes present, none predominant. Suggest appropriate recollection if clinically indicated.   Report Status 09/11/2012 FINAL   Final     Studies: US Renal  09/10/2012  *RADIOLOGY REPORT*  Clinical Data: Acute on chronic renal disease.  RENAL/URINARY TRACT ULTRASOUND COMPLETE  Comparison:  CT abdomen and pelvis 03/09/2011.  Findings:  Right Kidney:  Measures 10.0 cm and appears normal without stone, mass or hydronephrosis.  Left Kidney:  Measures 11.3 cm and appears normal without stone, mass or hydronephrosis.  Bladder:  Unremarkable.  IMPRESSION: Negative exam.   Original Report Authenticated By: Holley Dexter, M.D.    Dg Chest Portable 1 View  09/09/2012  *RADIOLOGY REPORT*  Clinical Data: Hypotension, chest pain, shortness of breath, dizziness  PORTABLE CHEST - 1 VIEW  Comparison: 09/02/2012  Findings: Heart size upper limits normal for technique. There are low lung volumes with resultant crowding of bronchovascular structures.  No focal infiltrate.  No overt edema.  No effusion. Degenerative spurring in bilateral shoulders.  IMPRESSION:  1.  Low volumes.  No acute disease.   Original Report Authenticated By: D. Andria Rhein, MD     Scheduled Meds: . aspirin EC  81 mg Oral Daily  . atorvastatin  20 mg Oral q1800  . buPROPion  150 mg Oral Daily  . carvedilol  3.125 mg Oral BID WC  . docusate sodium  100 mg Oral BID  . gabapentin  600 mg Oral TID  . pantoprazole  40 mg Oral Daily  . pentosan polysulfate  100 mg Oral Daily  . sodium chloride  3 mL Intravenous Q12H   Continuous Infusions: . sodium chloride 100 mL/hr at 09/11/12 0405    Principal Problem:   Unstable angina Active Problems:   ARF (acute renal failure)   Hypotension arterial   Elevated troponin   CAD, LAD DES '05, four caths since have shown patent LAD, last cath 11/11   HTN (hypertension), on muiltiple anti  hypertensives prior to admission   Chronic pain syndrome   Chronic cystitis   History of CVA , Lt brain    Time spent: 25 min    [name],[name]  [facility] Pager [phone]. If 7PM-7AM, please contact night-coverage at [email or web address], password [facility] 09/11/2012, 9:35 AM  LOS: 2 days

## 2012-09-11 NOTE — Progress Notes (Signed)
INITIAL NUTRITION ASSESSMENT  DOCUMENTATION CODES Per approved criteria  -Obesity Unspecified   INTERVENTION: 1. No nutrition interventions at this time 2. RD will continue to follow  NUTRITION DIAGNOSIS: Unintentional weight loss related to poor appetite as evidenced by pt reported weight loss.   Goal: PO intake to remain >/=75% meal completion  Monitor:  PO intake, weight trends   Reason for Assessment: Malnutrition Screening Tool   62 y.o. female  Admitting Dx: Unstable angina  ASSESSMENT: Pt admitted with chest pain.  Pt reports about 30 lb weight loss in the past month. Reports she weighed about 235 lbs at her MD office about 1 month ago. Currently 198 lbs, this would be a 37 lb weight loss in the past month, 16% body weight. No record of this weight, and pt seems a little confused. Question if this could be fluid weight? Does report very little appetite, eating only bites here and there. Ate 100% of dinner last night and breakfast this morning.  Some weight loss is appropriate for this pt.   Height: Ht Readings from Last 1 Encounters:  09/09/12 5\' 4"  (1.626 m)    Weight: Wt Readings from Last 1 Encounters:  09/11/12 198 lb 6.6 oz (90 kg)    Ideal Body Weight: 120 lbs   % Ideal Body Weight: 165%  Wt Readings from Last 10 Encounters:  09/11/12 198 lb 6.6 oz (90 kg)  08/14/09 205 lb 6.4 oz (93.169 kg)    Usual Body Weight: 235 lbs per pt report, 1 month ago   % Usual Body Weight: 84%  BMI:  Body mass index is 34.04 kg/(m^2). Obesity class 1  Estimated Nutritional Needs: Kcal: 1800-2000 Protein: 65-75 gm  Fluid: 1.4-1.8 L  Skin: intact   Diet Order: Cardiac  EDUCATION NEEDS: -No education needs identified at this time   Intake/Output Summary (Last 24 hours) at 09/11/12 1014 Last data filed at 09/11/12 1001  Gross per 24 hour  Intake   2590 ml  Output   1025 ml  Net   1565 ml    Last BM: 3/7    Labs:   Recent Labs Lab 09/09/12 1803  09/10/12 0405 09/10/12 1615 09/11/12 0555  NA 136 140  --  139  K 3.4* 2.9* 4.8 4.5  CL 97 101  --  104  CO2 26 25  --  22  BUN 24* 26*  --  21  CREATININE 3.44* 3.10*  --  2.05*  CALCIUM 9.7 9.3  --  9.1  MG  --   --  2.1  --   GLUCOSE 175* 120*  --  116*    CBG (last 3)  No results found for this basename: GLUCAP,  in the last 72 hours  Scheduled Meds: . aspirin EC  81 mg Oral Daily  . atorvastatin  20 mg Oral q1800  . buPROPion  150 mg Oral Daily  . carvedilol  3.125 mg Oral BID WC  . cefTRIAXone (ROCEPHIN)  IV  1 g Intravenous Q24H  . docusate sodium  100 mg Oral BID  . gabapentin  600 mg Oral TID  . pantoprazole  40 mg Oral Daily  . pentosan polysulfate  100 mg Oral Daily  . sodium chloride  3 mL Intravenous Q12H    Continuous Infusions: . sodium chloride 100 mL/hr at 09/11/12 0405    Past Medical History  Diagnosis Date  . CAD in native artery   . HTN (hypertension), benign   . Hyperlipidemia   .  Depression   . Chronic back pain   . GERD (gastroesophageal reflux disease)   . Myocardial infarction   . Anginal pain   . Anxiety   . Asthma   . Pneumonia   . Peripheral vascular disease   . Neuromuscular disorder   . Headache   . Arthritis   . Fibromyalgia   . Anemia     Past Surgical History  Procedure Laterality Date  . Hernia repair    . Abdominal hysterectomy      Clarene Duke RD, LDN Pager [phone] After Hours pager [phone]

## 2012-09-12 LAB — CBC
HCT: 35.7 % — ABNORMAL LOW (ref 36.0–46.0)
Hemoglobin: 12.1 g/dL (ref 12.0–15.0)
RDW: 14.1 % (ref 11.5–15.5)
WBC: 20.5 10*3/uL — ABNORMAL HIGH (ref 4.0–10.5)

## 2012-09-12 LAB — HEPARIN LEVEL (UNFRACTIONATED): Heparin Unfractionated: <0.1 IU/mL — ABNORMAL LOW (ref 0.30–0.70)

## 2012-09-12 LAB — BASIC METABOLIC PANEL
BUN: 12 mg/dL (ref 6–23)
CO2: 23 mEq/L (ref 19–32)
Chloride: 110 mEq/L (ref 96–112)
Creatinine, Ser: 1.16 mg/dL — ABNORMAL HIGH (ref 0.50–1.10)

## 2012-09-12 LAB — URINE CULTURE
Colony Count: NO GROWTH
Special Requests: NORMAL

## 2012-09-12 MED ORDER — PHENAZOPYRIDINE HCL 100 MG PO TABS
100.0000 mg | ORAL_TABLET | Freq: Three times a day (TID) | ORAL | Status: AC
  Administered 2012-09-12 – 2012-09-15 (×9): 100 mg via ORAL
  Filled 2012-09-12 (×10): qty 1

## 2012-09-12 MED ORDER — HEPARIN BOLUS VIA INFUSION
1400.0000 [IU] | Freq: Once | INTRAVENOUS | Status: AC
  Administered 2012-09-12: 1400 [IU] via INTRAVENOUS
  Filled 2012-09-12: qty 1400

## 2012-09-12 MED ORDER — OXYCODONE HCL 5 MG PO TABS
5.0000 mg | ORAL_TABLET | ORAL | Status: DC | PRN
  Administered 2012-09-12 – 2012-09-17 (×8): 5 mg via ORAL
  Filled 2012-09-12 (×9): qty 1

## 2012-09-12 MED ORDER — HEPARIN BOLUS VIA INFUSION
3000.0000 [IU] | Freq: Once | INTRAVENOUS | Status: AC
  Administered 2012-09-12: 3000 [IU] via INTRAVENOUS
  Filled 2012-09-12: qty 3000

## 2012-09-12 MED ORDER — GABAPENTIN 300 MG PO CAPS
300.0000 mg | ORAL_CAPSULE | Freq: Two times a day (BID) | ORAL | Status: DC
  Administered 2012-09-12 – 2012-09-18 (×13): 300 mg via ORAL
  Filled 2012-09-12 (×14): qty 1

## 2012-09-12 MED ORDER — HEPARIN (PORCINE) IN NACL 100-0.45 UNIT/ML-% IJ SOLN
1450.0000 [IU]/h | INTRAMUSCULAR | Status: DC
  Administered 2012-09-12: 1000 [IU]/h via INTRAVENOUS
  Administered 2012-09-13 – 2012-09-14 (×2): 1450 [IU]/h via INTRAVENOUS
  Filled 2012-09-12 (×6): qty 250

## 2012-09-12 NOTE — Progress Notes (Signed)
Asked to re eval pt secondary to improved renal status and recurrent chest pain.  Additionally increase of troponin from 0.84 at previous peak to now 1.25.  No change in EKG.  Recent negative echo stress.   Subjective: Pain free currently  Objective: Vital signs in last 24 hours: Temp:  [98.1 F (36.7 C)-98.7 F (37.1 C)] 98.1 F (36.7 C) (03/08 0500) Pulse Rate:  [65-78] 70 (03/08 0500) Resp:  [20-22] 22 (03/07 2150) BP: (92-147)/(44-81) 132/70 mmHg (03/08 0500) SpO2:  [94 %-95 %] 95 % (03/08 0500) Weight:  [90.4 kg (199 lb 4.7 oz)] 90.4 kg (199 lb 4.7 oz) (03/08 0500) Weight change: 0.4 kg (14.1 oz) Last BM Date: 09/11/12 Intake/Output from previous day:  -1790 03/07 0701 - 03/08 0700 In: 1010 [P.O.:960; IV Piggyback:50] Out: 2800 [Urine:2800] Intake/Output this shift: Total I/O In: 0  Out: 500 [Urine:500]  PE: General:awake, no pain now Heart:S1S2 RRR Lungs:clear Abd:+ BS, soft, non tender Ext:no edema    Lab Results:  Recent Labs  09/10/12 0405 09/11/12 0555  WBC 12.4* 17.9*  HGB 14.2 12.4  HCT 40.7 37.5  PLT 385 353   BMET  Recent Labs  09/11/12 0555 09/12/12 0540  NA 139 142  K 4.5 4.0  CL 104 110  CO2 22 23  GLUCOSE 116* 105*  BUN 21 12  CREATININE 2.05* 1.16*  CALCIUM 9.1 8.9    Recent Labs  09/11/12 2314 09/12/12 0540  TROPONINI 1.25* 1.22*    Lab Results  Component Value Date   CHOL  Value: 198        ATP III CLASSIFICATION:  <200     mg/dL   Desirable  161-096  mg/dL   Borderline High  >=045    mg/dL   High        40/98/1191   HDL 57 05/19/2010   LDLCALC  Value: 117        Total Cholesterol/HDL:CHD Risk Coronary Heart Disease Risk Table                     Men   Women  1/2 Average Risk   3.4   3.3  Average Risk       5.0   4.4  2 X Average Risk   9.6   7.1  3 X Average Risk  23.4   11.0        Use the calculated Patient Ratio above and the CHD Risk Table to determine the patient's CHD Risk.        ATP III CLASSIFICATION (LDL):  <100      mg/dL   Optimal  478-295  mg/dL   Near or Above                    Optimal  130-159  mg/dL   Borderline  621-308  mg/dL   High  >657     mg/dL   Very High* 84/69/6295   TRIG 122 05/19/2010   CHOLHDL 3.5 05/19/2010   Lab Results  Component Value Date   HGBA1C 5.8* 03/07/2011     Lab Results  Component Value Date   TSH 4.758* 09/09/2012    Hepatic Function Panel  Recent Labs  09/09/12 1803  PROT 7.3  ALBUMIN 3.8  AST 27  ALT 23  ALKPHOS 129*  BILITOT 0.3    Studies/Results: US Renal  09/10/2012  *RADIOLOGY REPORT*  Clinical Data: Acute on chronic renal disease.  RENAL/URINARY TRACT ULTRASOUND COMPLETE  Comparison:  CT abdomen and pelvis 03/09/2011.  Findings:  Right Kidney:  Measures 10.0 cm and appears normal without stone, mass or hydronephrosis.  Left Kidney:  Measures 11.3 cm and appears normal without stone, mass or hydronephrosis.  Bladder:  Unremarkable.  IMPRESSION: Negative exam.   Original Report Authenticated By: Holley Dexter, M.D.     Medications: I have reviewed the patient's current medications. Marland Kitchen aspirin EC  81 mg Oral Daily  . atorvastatin  20 mg Oral q1800  . buPROPion  150 mg Oral Daily  . carvedilol  3.125 mg Oral BID WC  . cefTRIAXone (ROCEPHIN)  IV  1 g Intravenous Q24H  . docusate sodium  100 mg Oral BID  . gabapentin  300 mg Oral BID  . pantoprazole  40 mg Oral Daily  . pentosan polysulfate  100 mg Oral Daily  . sodium chloride  3 mL Intravenous Q12H   Assessment/Plan: Principal Problem:   Chest pain with low risk of acute coronary syndrome; likely musculoskeletal Active Problems:   ARF (acute renal failure)   Hypotension arterial   Elevated troponin   CAD, LAD DES '05, four caths since have shown patent LAD, last cath 11/11   HTN (hypertension), on muiltiple anti hypertensives prior to admission   Chronic pain syndrome   Chronic cystitis   History of CVA , Lt brain  PLAN: Now with recurrent chest pain nitrate responsive.and increasing  troponin with improved renal function,  Will proceed with cath, previously jailed sept. Perforator,   Will add Imdur for now.  LOS: 3 days   INGOLD,LAURA R 09/12/2012, 10:34 AM   Agree with note written by Nada Boozer RNP  Recurrent nitrate responsive CP with rising Trop and H/O CAD. SCr improved. Will arrange cath for Monday (Radial). Start on po imdur and IV hep.  Runell Gess 09/12/2012 10:50 AM

## 2012-09-12 NOTE — Progress Notes (Addendum)
[facility] PROGRESS NOTE  [name] ZOX:[id number] DOB: July 23, 1950 DOA: 09/09/2012 PCP: No primary provider on file.  Assessment/Plan: 1. Atypical chest pain: resolved now. She had another episode of chest pain last night relieved by nitro. Her enzymes were elevated and cardiology reconsulted. Plan for cath on monday - she had a recent stress test negative for inducible ischemia.  - her EKG does not show any ischemic changes.  - cardiology consultation from [facility] requestedn recommended no further intervention. - discontinued the heparin. Resume aspirin/lipitor and coreg.    2. Acute on CKD stage 2: improving.  her baseline creatinine is 1.05 in 2012. Probably pre renal in view of her hypotension and secondary to ACE I.   Her creatitine on admission is 3.44 and has improved to 1.16. Renal ultrasound negative for any obstructive causes.  I/O last 3 completed shifts: In: 2062 [P.O.:1440; I.V.:572; IV Piggyback:50] Out: 3350 [Urine:3350] Total I/O In: 460 [P.O.:460] Out: 1400 [Urine:1400]   3. Hypokalemia: replete as needed. Magnesium levels within normal limits.   4. CAD S/P stents: now chest pain free . Resume home medications.   5. Anxiety: klonipin prn . Hold for sedation.   6. Leukocytosis: probably reactive. Urine culture negative, cxr neg . procalcitonin is negative. c diff pcr negative  DVT prophylaxis.    Code Status: full code Family Communication: none at bedside Disposition Plan: pending.    Consultants:  cardiology  HPI/Subjective:  unsteady on her feet. PT/OT evaluation ordered.  Objective: Filed Vitals:   09/11/12 2248 09/12/12 0500 09/12/12 1320 09/12/12 1352  BP: 120/78 132/70 161/95 119/64  Pulse:  70 88 81  Temp:  98.1 F (36.7 C)  98.4 F (36.9 C)  TempSrc:  Oral  Oral  Resp:    20  Height:      Weight:  90.4 kg (199 lb 4.7 oz)    SpO2:  95%  98%    Intake/Output Summary (Last 24 hours) at 09/12/12 1750 Last data filed at 09/12/12  1719  Gross per 24 hour  Intake    940 ml  Output   3100 ml  Net  -2160 ml   Filed Weights   09/10/12 0459 09/11/12 0529 09/12/12 0500  Weight: 87.6 kg (193 lb 2 oz) 90 kg (198 lb 6.6 oz) 90.4 kg (199 lb 4.7 oz)    Exam: Alert afebrile comfortable CHEST: Normal respiration, clear to auscultation bilaterally.  HEART: Regular rate and rhythm. There are no murmur, rub, or gallops.  BACK: No kyphosis or scoliosis; no CVA tenderness  ABDOMEN: soft and non-tender; no masses, no organomegaly, normal abdominal bowel sounds; no pannus; no intertriginous candida. There is no rebound and no distention.  EXTREMITIES: No bone or joint deformity; age-appropriate arthropathy of the hands and knees; no edema; no ulcerations. There is no calf tenderness   Data Reviewed: Basic Metabolic Panel:  Recent Labs Lab 09/09/12 1803 09/10/12 0405 09/10/12 1615 09/11/12 0555 09/12/12 0540  NA 136 140  --  139 142  K 3.4* 2.9* 4.8 4.5 4.0  CL 97 101  --  104 110  CO2 26 25  --  22 23  GLUCOSE 175* 120*  --  116* 105*  BUN 24* 26*  --  21 12  CREATININE 3.44* 3.10*  --  2.05* 1.16*  CALCIUM 9.7 9.3  --  9.1 8.9  MG  --   --  2.1  --   --    Liver Function Tests:  Recent Labs Lab 09/09/12  1803  AST 27  ALT 23  ALKPHOS 129*  BILITOT 0.3  PROT 7.3  ALBUMIN 3.8   No results found for this basename: LIPASE, AMYLASE,  in the last 168 hours No results found for this basename: AMMONIA,  in the last 168 hours CBC:  Recent Labs Lab 09/09/12 1803 09/10/12 0405 09/11/12 0555 09/12/12 1255  WBC 12.8* 12.4* 17.9* 20.5*  HGB 15.6* 14.2 12.4 12.1  HCT 43.4 40.7 37.5 35.7*  MCV 89.1 87.3 92.8 91.3  PLT 428* 385 353 261   Cardiac Enzymes:  Recent Labs Lab 09/10/12 0405 09/10/12 0927 09/11/12 2314 09/12/12 0540 09/12/12 1000  TROPONINI 0.76* 0.84* 1.25* 1.22* 1.28*   BNP (last 3 results) No results found for this basename: PROBNP,  in the last 8760 hours CBG: No results found for  this basename: GLUCAP,  in the last 168 hours  Recent Results (from the past 240 hour(s))  URINE CULTURE     Status: None   Collection Time    09/09/12  7:34 PM      Result Value Range Status   Specimen Description URINE, CLEAN CATCH   Final   Special Requests NONE   Final   Culture  Setup Time 09/10/2012 02:51   Final   Colony Count 60,000 COLONIES/ML   Final   Culture     Final   Value: Multiple bacterial morphotypes present, none predominant. Suggest appropriate recollection if clinically indicated.   Report Status 09/11/2012 FINAL   Final  URINE CULTURE     Status: None   Collection Time    09/11/12  9:45 AM      Result Value Range Status   Specimen Description URINE, RANDOM   Final   Special Requests Normal   Final   Culture  Setup Time 09/11/2012 10:00   Final   Colony Count NO GROWTH   Final   Culture NO GROWTH   Final   Report Status 09/12/2012 FINAL   Final  CLOSTRIDIUM DIFFICILE BY PCR     Status: None   Collection Time    09/11/12 12:48 PM      Result Value Range Status   C difficile by pcr NEGATIVE  NEGATIVE Final     Studies: No results found.  Scheduled Meds: . aspirin EC  81 mg Oral Daily  . atorvastatin  20 mg Oral q1800  . buPROPion  150 mg Oral Daily  . carvedilol  3.125 mg Oral BID WC  . cefTRIAXone (ROCEPHIN)  IV  1 g Intravenous Q24H  . docusate sodium  100 mg Oral BID  . gabapentin  300 mg Oral BID  . pantoprazole  40 mg Oral Daily  . pentosan polysulfate  100 mg Oral Daily  . [START ON 09/13/2012] phenazopyridine  100 mg Oral TID WC  . sodium chloride  3 mL Intravenous Q12H   Continuous Infusions: . sodium chloride 100 mL/hr at 09/12/12 0006  . heparin 1,000 Units/hr (09/12/12 1140)    Principal Problem:   Chest pain with low risk of acute coronary syndrome; likely musculoskeletal Active Problems:   ARF (acute renal failure)   Hypotension arterial   Elevated troponin   CAD, LAD DES '05, four caths since have shown patent LAD, last cath  11/11   HTN (hypertension), on muiltiple anti hypertensives prior to admission   Chronic pain syndrome   Chronic cystitis   History of CVA , Lt brain    Time spent: 25 min    [name]  [facility] Pager [phone]. If 7PM-7AM, please contact night-coverage at [email or web address], password [facility] 09/12/2012, 5:50 PM  LOS: 3 days

## 2012-09-12 NOTE — Progress Notes (Addendum)
Pt spoke a lot of grandson and son. Pt seemed to be depressed as she was tearful.  She was also fearful of seeing her deceased mother in dreams.  We talked about and what her mother meant to her and what the dreams mean to her. Pt said she wants to live but did not want to continue to live in pain. Pt wanted prayer and wants peace. Provided empathic listening to patient. Pt was thankful for visit. Marjory Lies Chaplain

## 2012-09-12 NOTE — Progress Notes (Signed)
  Echocardiogram 2D Echocardiogram has been performed.  MORFORD, MELISSA 09/12/2012, 11:33 AM

## 2012-09-12 NOTE — Progress Notes (Signed)
ANTICOAGULATION CONSULT NOTE - Follow Up Consult  Pharmacy Consult for heparin Indication: chest pain/ACS  No Known Allergies  Labs:  Recent Labs  09/10/12 0405  09/10/12 1615 09/11/12 0555 09/11/12 2314 09/12/12 0540 09/12/12 1000 09/12/12 1255 09/12/12 1815  HGB 14.2  --   --  12.4  --   --   --  12.1  --   HCT 40.7  --   --  37.5  --   --   --  35.7*  --   PLT 385  --   --  353  --   --   --  261  --   HEPARINUNFRC 0.30  --  0.32 0.25*  --   --   --   --  <0.10*  CREATININE 3.10*  --   --  2.05*  --  1.16*  --   --   --   TROPONINI 0.76*  < >  --   --  1.25* 1.22* 1.28*  --   --   < > = values in this interval not displayed.  Estimated Creatinine Clearance: 55.5 ml/min (by C-G formula based on Cr of 1.16).   Assessment: 62 yo F with hx CAD started on heparin for chest pain with elevated troponin and normal EKG. Troponin elevation initially thought secondary to ARF as opposed to cardiac indications and heparin d/c'd on 3/7 . 3/8 reconsulted for heparin as  troponins peaked to 1.28 in light of improving renal function. Plan for radial cath on Monday .   Heparin level < 0.10  Goal of Therapy:  Heparin level 0.3-0.7 units/ml Monitor platelets by anticoagulation protocol: Yes   Plan:  1. Heparin bolus 3000 units 2. Heparin gtt 1300 units/hour 3. AM heparin level, CBC  Thank you. Okey Regal, PharmD [phone]  09/12/2012 7:36 PM

## 2012-09-12 NOTE — Progress Notes (Signed)
ANTICOAGULATION CONSULT NOTE - Follow Up Consult  Pharmacy Consult for heparin Indication: chest pain/ACS  No Known Allergies  Patient Measurements: Height: 5\' 4"  (162.6 cm) Weight: 199 lb 4.7 oz (90.4 kg) (scale c) IBW/kg (Calculated) : 54.7  Vital Signs: Temp: 98.1 F (36.7 C) (03/08 0500) Temp src: Oral (03/08 0500) BP: 132/70 mmHg (03/08 0500) Pulse Rate: 70 (03/08 0500)  Labs:  Recent Labs  09/09/12 1803  09/10/12 0405  09/10/12 1615 09/11/12 0555 09/11/12 2314 09/12/12 0540 09/12/12 1000  HGB 15.6*  --  14.2  --   --  12.4  --   --   --   HCT 43.4  --  40.7  --   --  37.5  --   --   --   PLT 428*  --  385  --   --  353  --   --   --   HEPARINUNFRC  --   --  0.30  --  0.32 0.25*  --   --   --   CREATININE 3.44*  --  3.10*  --   --  2.05*  --  1.16*  --   TROPONINI  --   < > 0.76*  < >  --   --  1.25* 1.22* 1.28*  < > = values in this interval not displayed.  Estimated Creatinine Clearance: 55.5 ml/min (by C-G formula based on Cr of 1.16).   Medications:  Scheduled:  . aspirin EC  81 mg Oral Daily  . atorvastatin  20 mg Oral q1800  . buPROPion  150 mg Oral Daily  . carvedilol  3.125 mg Oral BID WC  . cefTRIAXone (ROCEPHIN)  IV  1 g Intravenous Q24H  . docusate sodium  100 mg Oral BID  . gabapentin  300 mg Oral BID  . heparin  1,400 Units Intravenous Once  . [COMPLETED]  morphine injection  1 mg Intravenous Once  . [COMPLETED] nitroGLYCERIN      . pantoprazole  40 mg Oral Daily  . pentosan polysulfate  100 mg Oral Daily  . sodium chloride  3 mL Intravenous Q12H  . [DISCONTINUED] gabapentin  600 mg Oral TID    Assessment: 62 yo F with hx CAD started on heparin for chest pain with elevated troponin and normal EKG. Troponin elevation initially thought secondary to ARF as opposed to cardiac indications and heparin d/c'd on 3/7 . 3/8 reconsulted for heparin as  troponins peaked to 1.28 in light of improving renal function. Plan for radial cath on Monday . HL  0.25  No bleeding noted.  Goal of Therapy:  Heparin level 0.3-0.7 units/ml Monitor platelets by anticoagulation protocol: Yes   Plan:  1. Heparin bolus 1400 units 2. Heparin gtt 1000 units/hour 3. 6 hour heparin level   Bola A. Wandra Feinstein D Clinical Pharmacist Pager:[phone] Phone [phone] 09/12/2012 11:30 AM

## 2012-09-12 NOTE — Progress Notes (Signed)
  Comment:  Patient had episode of chest pain, late night 09/11/12, wit atypical features, unrelieved by SL NTG, and associated with generalized pains. Resolved with iv Morphine Sulfate injection.   Note:  1. 12-Lead EKG. No acute ischemic changes. 2. Cardiac Enzymes cycled. This showed bump in troponin to 1.25. 3. Patient pain-free after Morphine.  4. Already on ASA/Beta-blocker.  5. Requesting 2D Echocardiogram to evaluate for regional wall motion abnormalities.   Rounding MD to consider re-consulting cardiology in AM 09/10/12.   C. Cotina Freedman. MD, FACP.  Marland Kitchen

## 2012-09-12 NOTE — Progress Notes (Signed)
Pt troponin 1.25, previous troponin 0.84. Pt no longer having chest pain. MD notified. Will continue to monitor. Jobe Igo A RN 09/12/2012

## 2012-09-12 NOTE — Progress Notes (Signed)
Pt c/o c/p, BP 161/95, EKG done, PRN nitro given with relief, pt also stated she feels hopeless, pt very tearful and worried, she asked to see the chaplin, chaplin paged, Dr. Blake Divine notified, PRN clonopin also given to help with anxiety.  Pts son called and informed staff that there was a death in the family which may also be contributing to pt anxiety, will continue to monitor

## 2012-09-13 LAB — CBC
HCT: 32.3 % — ABNORMAL LOW (ref 36.0–46.0)
Hemoglobin: 11 g/dL — ABNORMAL LOW (ref 12.0–15.0)
MCHC: 34.1 g/dL (ref 30.0–36.0)
RBC: 3.62 MIL/uL — ABNORMAL LOW (ref 3.87–5.11)

## 2012-09-13 LAB — HEPARIN LEVEL (UNFRACTIONATED)
Heparin Unfractionated: 0.21 IU/mL — ABNORMAL LOW (ref 0.30–0.70)
Heparin Unfractionated: 0.47 IU/mL (ref 0.30–0.70)
Heparin Unfractionated: 0.59 IU/mL (ref 0.30–0.70)

## 2012-09-13 LAB — BASIC METABOLIC PANEL
BUN: 8 mg/dL (ref 6–23)
Chloride: 105 mEq/L (ref 96–112)
GFR calc Af Amer: 60 mL/min — ABNORMAL LOW (ref >90)
Glucose, Bld: 93 mg/dL (ref 70–99)
Potassium: 3.6 mEq/L (ref 3.5–5.1)
Sodium: 140 mEq/L (ref 135–145)

## 2012-09-13 MED ORDER — SODIUM CHLORIDE 0.9 % IJ SOLN: 3.0000 mL | INTRAMUSCULAR | Status: DC | PRN

## 2012-09-13 MED ORDER — ALBUTEROL SULFATE HFA 108 (90 BASE) MCG/ACT IN AERS
2.0000 | INHALATION_SPRAY | RESPIRATORY_TRACT | Status: DC | PRN
  Administered 2012-09-13 – 2012-09-14 (×2): 2 via RESPIRATORY_TRACT
  Filled 2012-09-13: qty 6.7

## 2012-09-13 MED ORDER — ALBUTEROL SULFATE (5 MG/ML) 0.5% IN NEBU
2.5000 mg | INHALATION_SOLUTION | RESPIRATORY_TRACT | Status: DC | PRN
  Filled 2012-09-13: qty 0.5

## 2012-09-13 MED ORDER — SODIUM CHLORIDE 0.9 % IV SOLN: 1.0000 mL/kg/h | INTRAVENOUS | Status: DC

## 2012-09-13 MED ORDER — SODIUM CHLORIDE 0.9 % IV SOLN: 250.0000 mL | INTRAVENOUS | Status: DC | PRN

## 2012-09-13 MED ORDER — SODIUM CHLORIDE 0.9 % IJ SOLN
3.0000 mL | Freq: Two times a day (BID) | INTRAMUSCULAR | Status: DC
  Administered 2012-09-14: 3 mL via INTRAVENOUS

## 2012-09-13 MED ORDER — HEPARIN BOLUS VIA INFUSION
2000.0000 [IU] | Freq: Once | INTRAVENOUS | Status: AC
  Administered 2012-09-13: 2000 [IU] via INTRAVENOUS
  Filled 2012-09-13: qty 2000

## 2012-09-13 MED ORDER — DIAZEPAM 5 MG PO TABS
5.0000 mg | ORAL_TABLET | ORAL | Status: AC
  Administered 2012-09-14: 5 mg via ORAL
  Filled 2012-09-13: qty 1

## 2012-09-13 MED ORDER — ASPIRIN 81 MG PO CHEW
324.0000 mg | CHEWABLE_TABLET | ORAL | Status: AC
Start: 2012-09-14 — End: 2012-09-14
  Administered 2012-09-14: 324 mg via ORAL
  Filled 2012-09-13: qty 4

## 2012-09-13 MED ORDER — ALBUTEROL SULFATE (5 MG/ML) 0.5% IN NEBU
2.5000 mg | INHALATION_SOLUTION | RESPIRATORY_TRACT | Status: DC | PRN
  Administered 2012-09-13: 2.5 mg via RESPIRATORY_TRACT
  Filled 2012-09-13: qty 0.5

## 2012-09-13 NOTE — Progress Notes (Signed)
ANTICOAGULATION CONSULT NOTE  Pharmacy Consult for heparin Indication: chest pain/ACS  No Known Allergies  Labs:  Recent Labs  09/11/12 0555 09/11/12 2314 09/12/12 0540 09/12/12 1000 09/12/12 1255 09/12/12 1815 09/13/12 0525  HGB 12.4  --   --   --  12.1  --  11.0*  HCT 37.5  --   --   --  35.7*  --  32.3*  PLT 353  --   --   --  261  --  278  HEPARINUNFRC 0.25*  --   --   --   --  <0.10* 0.21*  CREATININE 2.05*  --  1.16*  --   --   --   --   TROPONINI  --  1.25* 1.22* 1.28*  --   --   --     Estimated Creatinine Clearance: 55.4 ml/min (by C-G formula based on Cr of 1.16).   Assessment: 62 yo Female with chest pain and elevated cardiac markers for heparin.  Goal of Therapy:  Heparin level 0.3-0.7 units/ml Monitor platelets by anticoagulation protocol: Yes   Plan:  Heparin 2000 units IV bolus, then increase heparin 1450 units/hr Check heparin level in 6 hours.  Geannie Risen, PharmD, BCPS 09/13/2012 6:49 AM

## 2012-09-13 NOTE — Progress Notes (Signed)
[facility] PROGRESS NOTE  [name] WJX:[id number] DOB: [birth year] DOA: 09/09/2012 PCP: No primary provider on file.  Assessment/Plan: 1. Atypical chest pain: resolved now. She had another episode of chest pain 3/7 relieved by nitro. Her enzymes were elevated and cardiology reconsulted. Plan for cath on monday - she had a recent stress test negative for inducible ischemia.  - her EKG does not show any ischemic changes.  - resumed the heparin. Resume aspirin/lipitor and coreg.    2. Acute on CKD stage 2: improving.  her baseline creatinine is 1.05 in 2012. Probably pre renal in view of her hypotension and secondary to ACE I.   Her creatitine on admission is 3.44 and has improved to 1.13. Renal ultrasound negative for any obstructive causes.  I/O last 3 completed shifts: In: 4172.9 [P.O.:2380; I.V.:1392.9; Other:400] Out: 6250 [Urine:6250]     3. Hypokalemia: replete as needed. Magnesium levels within normal limits.   4. CAD S/P stents: now chest pain free . Resume home medications.   5. Anxiety: klonipin prn . Hold for sedation.   6. Leukocytosis: probably reactive. Urine culture negative, cxr neg . procalcitonin is negative. c diff pcr negative  7. Wheezing today.: stopped the fluids. As per te patient she has asthma, and is on albuterol which is not on her home medications. But we have started her on albuterol nebs and ordered a cxr. She has good oxygen sats on RA.   DVT prophylaxis.    Code Status: full code Family Communication: none at bedside Disposition Plan: pending.    Consultants:  cardiology  HPI/Subjective:  unsteady on her feet. PT/OT evaluation ordered recommended SNF placement.  She is curled up in the bed. Reports her pain is better. Slightly tachypnic.   Objective: Filed Vitals:   09/13/12 0506 09/13/12 0600 09/13/12 1346 09/13/12 1400  BP:  144/69  144/63  Pulse:  62  57  Temp:  98.9 F (37.2 C)  98.2 F (36.8 C)  TempSrc:  Oral  Oral   Resp:  19  22  Height:      Weight:  90.1 kg (198 lb 10.2 oz)    SpO2: 96% 95% 96% 96%    Intake/Output Summary (Last 24 hours) at 09/13/12 1901 Last data filed at 09/13/12 1800  Gross per 24 hour  Intake 3712.87 ml  Output   4250 ml  Net -537.13 ml   Filed Weights   09/11/12 0529 09/12/12 0500 09/13/12 0600  Weight: 90 kg (198 lb 6.6 oz) 90.4 kg (199 lb 4.7 oz) 90.1 kg (198 lb 10.2 oz)    Exam: Alert afebrile comfortable CHEST: slightly increased work of breathing and scattered inspi and exp wheezing heard.   HEART: Regular rate and rhythm. There are no murmur, rub, or gallops.  BACK: No kyphosis or scoliosis; no CVA tenderness  ABDOMEN: soft and non-tender; no masses, no organomegaly, normal abdominal bowel sounds; no pannus; no intertriginous candida. There is no rebound and no distention.  EXTREMITIES: No bone or joint deformity; age-appropriate arthropathy of the hands and knees; no edema; no ulcerations. There is no calf tenderness   Data Reviewed: Basic Metabolic Panel:  Recent Labs Lab 09/09/12 1803 09/10/12 0405 09/10/12 1615 09/11/12 0555 09/12/12 0540 09/13/12 0525  NA 136 140  --  139 142 140  K 3.4* 2.9* 4.8 4.5 4.0 3.6  CL 97 101  --  104 110 105  CO2 26 25  --  22 23 23   GLUCOSE 175* 120*  --  116* 105* 93  BUN 24* 26*  --  21 12 8   CREATININE 3.44* 3.10*  --  2.05* 1.16* 1.13*  CALCIUM 9.7 9.3  --  9.1 8.9 8.6  MG  --   --  2.1  --   --   --    Liver Function Tests:  Recent Labs Lab 09/09/12 1803  AST 27  ALT 23  ALKPHOS 129*  BILITOT 0.3  PROT 7.3  ALBUMIN 3.8   No results found for this basename: LIPASE, AMYLASE,  in the last 168 hours No results found for this basename: AMMONIA,  in the last 168 hours CBC:  Recent Labs Lab 09/09/12 1803 09/10/12 0405 09/11/12 0555 09/12/12 1255 09/13/12 0525  WBC 12.8* 12.4* 17.9* 20.5* 15.2*  HGB 15.6* 14.2 12.4 12.1 11.0*  HCT 43.4 40.7 37.5 35.7* 32.3*  MCV 89.1 87.3 92.8 91.3 89.2   PLT 428* 385 353 261 278   Cardiac Enzymes:  Recent Labs Lab 09/10/12 0405 09/10/12 0927 09/11/12 2314 09/12/12 0540 09/12/12 1000  TROPONINI 0.76* 0.84* 1.25* 1.22* 1.28*   BNP (last 3 results) No results found for this basename: PROBNP,  in the last 8760 hours CBG: No results found for this basename: GLUCAP,  in the last 168 hours  Recent Results (from the past 240 hour(s))  URINE CULTURE     Status: None   Collection Time    09/09/12  7:34 PM      Result Value Range Status   Specimen Description URINE, CLEAN CATCH   Final   Special Requests NONE   Final   Culture  Setup Time 09/10/2012 02:51   Final   Colony Count 60,000 COLONIES/ML   Final   Culture     Final   Value: Multiple bacterial morphotypes present, none predominant. Suggest appropriate recollection if clinically indicated.   Report Status 09/11/2012 FINAL   Final  URINE CULTURE     Status: None   Collection Time    09/11/12  9:45 AM      Result Value Range Status   Specimen Description URINE, RANDOM   Final   Special Requests Normal   Final   Culture  Setup Time 09/11/2012 10:00   Final   Colony Count NO GROWTH   Final   Culture NO GROWTH   Final   Report Status 09/12/2012 FINAL   Final  CLOSTRIDIUM DIFFICILE BY PCR     Status: None   Collection Time    09/11/12 12:48 PM      Result Value Range Status   C difficile by pcr NEGATIVE  NEGATIVE Final     Studies: No results found.  Scheduled Meds: . aspirin EC  81 mg Oral Daily  . atorvastatin  20 mg Oral q1800  . buPROPion  150 mg Oral Daily  . carvedilol  3.125 mg Oral BID WC  . docusate sodium  100 mg Oral BID  . gabapentin  300 mg Oral BID  . pantoprazole  40 mg Oral Daily  . pentosan polysulfate  100 mg Oral Daily  . phenazopyridine  100 mg Oral TID WC  . sodium chloride  3 mL Intravenous Q12H   Continuous Infusions: . heparin 1,450 Units/hr (09/13/12 0654)    Principal Problem:   Chest pain with low risk of acute coronary syndrome;  likely musculoskeletal Active Problems:   ARF (acute renal failure)   Hypotension arterial   Elevated troponin   CAD, LAD DES '05, four caths since have shown  patent LAD, last cath 11/11   HTN (hypertension), on muiltiple anti hypertensives prior to admission   Chronic pain syndrome   Chronic cystitis   History of CVA , Lt brain    Time spent: 25 min    [facility]  [facility] Pager [phone]. If 7PM-7AM, please contact night-coverage at [email or web address], password [facility] 09/13/2012, 7:01 PM  LOS: 4 days

## 2012-09-13 NOTE — Progress Notes (Signed)
Subjective:  Had one episode of CP last PM  Objective:  Temp:  [98.4 F (36.9 C)-99.4 F (37.4 C)] 98.9 F (37.2 C) (03/09 0600) Pulse Rate:  [62-88] 62 (03/09 0600) Resp:  [19-20] 19 (03/09 0600) BP: (119-161)/(64-95) 144/69 mmHg (03/09 0600) SpO2:  [95 %-98 %] 95 % (03/09 0600) Weight:  [90.1 kg (198 lb 10.2 oz)] 90.1 kg (198 lb 10.2 oz) (03/09 0600) Weight change: -0.3 kg (-10.6 oz)  Intake/Output from previous day: 03/08 0701 - 03/09 0700 In: 2411.7 [P.O.:1180; I.V.:1231.7] Out: 3700 [Urine:3700]  Intake/Output from this shift: Total I/O In: 360 [P.O.:360] Out: 750 [Urine:750]  Physical Exam: General appearance: alert, cooperative and no distress Neck: no adenopathy, no carotid bruit, no JVD, supple, symmetrical, trachea midline and thyroid not enlarged, symmetric, no tenderness/mass/nodules Lungs: clear to auscultation bilaterally Heart: regular rate and rhythm, S1, S2 normal, no murmur, click, rub or gallop Extremities: extremities normal, atraumatic, no cyanosis or edema  Lab Results: Results for orders placed during the hospital encounter of 09/09/12 (from the past 48 hour(s))  CLOSTRIDIUM DIFFICILE BY PCR     Status: None   Collection Time    09/11/12 12:48 PM      Result Value Range   C difficile by pcr NEGATIVE  NEGATIVE  TROPONIN I     Status: Abnormal   Collection Time    09/11/12 11:14 PM      Result Value Range   Troponin I 1.25 (*) <0.30 ng/mL   Comment:            Due to the release kinetics of cTnI,     a negative result within the first hours     of the onset of symptoms does not rule out     myocardial infarction with certainty.     If myocardial infarction is still suspected,     repeat the test at appropriate intervals.     CRITICAL VALUE NOTED.  VALUE IS CONSISTENT WITH PREVIOUSLY REPORTED AND CALLED VALUE.  BASIC METABOLIC PANEL     Status: Abnormal   Collection Time    09/12/12  5:40 AM      Result Value Range   Sodium 142  135 - 145  mEq/L   Potassium 4.0  3.5 - 5.1 mEq/L   Chloride 110  96 - 112 mEq/L   CO2 23  19 - 32 mEq/L   Glucose, Bld 105 (*) 70 - 99 mg/dL   BUN 12  6 - 23 mg/dL   Creatinine, Ser 1.61 (*) 0.50 - 1.10 mg/dL   Comment: DELTA CHECK NOTED   Calcium 8.9  8.4 - 10.5 mg/dL   GFR calc non Af Amer 50 (*) >90 mL/min   GFR calc Af Amer 58 (*) >90 mL/min   Comment:            The eGFR has been calculated     using the CKD EPI equation.     This calculation has not been     validated in all clinical     situations.     eGFR's persistently     <90 mL/min signify     possible Chronic Kidney Disease.  TROPONIN I     Status: Abnormal   Collection Time    09/12/12  5:40 AM      Result Value Range   Troponin I 1.22 (*) <0.30 ng/mL   Comment:            Due to the release  kinetics of cTnI,     a negative result within the first hours     of the onset of symptoms does not rule out     myocardial infarction with certainty.     If myocardial infarction is still suspected,     repeat the test at appropriate intervals.     CRITICAL VALUE NOTED.  VALUE IS CONSISTENT WITH PREVIOUSLY REPORTED AND CALLED VALUE.  TROPONIN I     Status: Abnormal   Collection Time    09/12/12 10:00 AM      Result Value Range   Troponin I 1.28 (*) <0.30 ng/mL   Comment:            Due to the release kinetics of cTnI,     a negative result within the first hours     of the onset of symptoms does not rule out     myocardial infarction with certainty.     If myocardial infarction is still suspected,     repeat the test at appropriate intervals.     CRITICAL VALUE NOTED.  VALUE IS CONSISTENT WITH PREVIOUSLY REPORTED AND CALLED VALUE.  CBC     Status: Abnormal   Collection Time    09/12/12 12:55 PM      Result Value Range   WBC 20.5 (*) 4.0 - 10.5 K/uL   RBC 3.91  3.87 - 5.11 MIL/uL   Hemoglobin 12.1  12.0 - 15.0 g/dL   HCT 28.4 (*) 13.2 - 44.0 %   MCV 91.3  78.0 - 100.0 fL   MCH 30.9  26.0 - 34.0 pg   MCHC 33.9  30.0 -  36.0 g/dL   RDW 10.2  72.5 - 36.6 %   Platelets 261  150 - 400 K/uL   Comment: DELTA CHECK NOTED     PLATELET COUNT CONFIRMED BY SMEAR  HEPARIN LEVEL (UNFRACTIONATED)     Status: Abnormal   Collection Time    09/12/12  6:15 PM      Result Value Range   Heparin Unfractionated <0.10 (*) 0.30 - 0.70 IU/mL   Comment:            IF HEPARIN RESULTS ARE BELOW     EXPECTED VALUES, AND PATIENT     DOSAGE HAS BEEN CONFIRMED,     SUGGEST FOLLOW UP TESTING     OF ANTITHROMBIN III LEVELS.  BASIC METABOLIC PANEL     Status: Abnormal   Collection Time    09/13/12  5:25 AM      Result Value Range   Sodium 140  135 - 145 mEq/L   Potassium 3.6  3.5 - 5.1 mEq/L   Chloride 105  96 - 112 mEq/L   CO2 23  19 - 32 mEq/L   Glucose, Bld 93  70 - 99 mg/dL   BUN 8  6 - 23 mg/dL   Creatinine, Ser 4.40 (*) 0.50 - 1.10 mg/dL   Calcium 8.6  8.4 - 34.7 mg/dL   GFR calc non Af Amer 51 (*) >90 mL/min   GFR calc Af Amer 60 (*) >90 mL/min   Comment:            The eGFR has been calculated     using the CKD EPI equation.     This calculation has not been     validated in all clinical     situations.     eGFR's persistently     <90 mL/min signify  possible Chronic Kidney Disease.  CBC     Status: Abnormal   Collection Time    09/13/12  5:25 AM      Result Value Range   WBC 15.2 (*) 4.0 - 10.5 K/uL   RBC 3.62 (*) 3.87 - 5.11 MIL/uL   Hemoglobin 11.0 (*) 12.0 - 15.0 g/dL   HCT 16.1 (*) 09.6 - 04.5 %   MCV 89.2  78.0 - 100.0 fL   MCH 30.4  26.0 - 34.0 pg   MCHC 34.1  30.0 - 36.0 g/dL   RDW 40.9  81.1 - 91.4 %   Platelets 278  150 - 400 K/uL  PROCALCITONIN     Status: None   Collection Time    09/13/12  5:25 AM      Result Value Range   Procalcitonin <0.10     Comment:            Interpretation:     PCT (Procalcitonin) <= 0.5 ng/mL:     Systemic infection (sepsis) is not likely.     Local bacterial infection is possible.     (NOTE)             ICU PCT Algorithm               Non ICU PCT  Algorithm        ----------------------------     ------------------------------             PCT < 0.25 ng/mL                 PCT < 0.1 ng/mL         Stopping of antibiotics            Stopping of antibiotics           strongly encouraged.               strongly encouraged.        ----------------------------     ------------------------------           PCT level decrease by               PCT < 0.25 ng/mL           >= 80% from peak PCT           OR PCT 0.25 - 0.5 ng/mL          Stopping of antibiotics                                                 encouraged.         Stopping of antibiotics               encouraged.        ----------------------------     ------------------------------           PCT level decrease by              PCT >= 0.25 ng/mL           < 80% from peak PCT            AND PCT >= 0.5 ng/mL            Continuing antibiotics  encouraged.           Continuing antibiotics                encouraged.        ----------------------------     ------------------------------         PCT level increase compared          PCT > 0.5 ng/mL             with peak PCT AND              PCT >= 0.5 ng/mL             Escalation of antibiotics                                              strongly encouraged.          Escalation of antibiotics            strongly encouraged.  HEPARIN LEVEL (UNFRACTIONATED)     Status: Abnormal   Collection Time    09/13/12  5:25 AM      Result Value Range   Heparin Unfractionated 0.21 (*) 0.30 - 0.70 IU/mL   Comment:            IF HEPARIN RESULTS ARE BELOW     EXPECTED VALUES, AND PATIENT     DOSAGE HAS BEEN CONFIRMED,     SUGGEST FOLLOW UP TESTING     OF ANTITHROMBIN III LEVELS.    Imaging: Imaging results have been reviewed  Assessment/Plan:   1. Principal Problem: 2.   Chest pain with low risk of acute coronary syndrome; likely musculoskeletal 3. Active Problems: 4.   ARF (acute renal  failure) 5.   Hypotension arterial 6.   Elevated troponin 7.   CAD, LAD DES '05, four caths since have shown patent LAD, last cath 11/11 8.   HTN (hypertension), on muiltiple anti hypertensives prior to admission 9.   Chronic pain syndrome 10.   Chronic cystitis 11.   History of CVA , Lt brain 12.   Time Spent Directly with Patient:  20 minutes  Length of Stay:  LOS: 4 days   On IV hep. Plan cardiac cath tomorrow with Dr. Herbie Baltimore to be done radially.  Runell Gess 09/13/2012, 10:33 AM

## 2012-09-13 NOTE — Progress Notes (Signed)
ANTICOAGULATION CONSULT NOTE - Follow Up Consult  Pharmacy Consult for heparin Indication: chest pain/ACS  No Known Allergies  Patient Measurements: Height: 5\' 4"  (162.6 cm) Weight: 198 lb 10.2 oz (90.1 kg) IBW/kg (Calculated) : 54.7  Vital Signs: Temp: 98.2 F (36.8 C) (03/09 1400) Temp src: Oral (03/09 1400) BP: 144/63 mmHg (03/09 1400) Pulse Rate: 57 (03/09 1400)  Labs:  Recent Labs  09/11/12 0555 09/11/12 2314 09/12/12 0540 09/12/12 1000 09/12/12 1255 09/12/12 1815 09/13/12 0525 09/13/12 1330  HGB 12.4  --   --   --  12.1  --  11.0*  --   HCT 37.5  --   --   --  35.7*  --  32.3*  --   PLT 353  --   --   --  261  --  278  --   HEPARINUNFRC 0.25*  --   --   --   --  <0.10* 0.21* 0.47  CREATININE 2.05*  --  1.16*  --   --   --  1.13*  --   TROPONINI  --  1.25* 1.22* 1.28*  --   --   --   --     Estimated Creatinine Clearance: 56.9 ml/min (by C-G formula based on Cr of 1.13).   Medications:  Scheduled:  . aspirin EC  81 mg Oral Daily  . atorvastatin  20 mg Oral q1800  . buPROPion  150 mg Oral Daily  . carvedilol  3.125 mg Oral BID WC  . docusate sodium  100 mg Oral BID  . gabapentin  300 mg Oral BID  . [COMPLETED] heparin  2,000 Units Intravenous Once  . [COMPLETED] heparin  3,000 Units Intravenous Once  . pantoprazole  40 mg Oral Daily  . pentosan polysulfate  100 mg Oral Daily  . phenazopyridine  100 mg Oral TID WC  . sodium chloride  3 mL Intravenous Q12H  . [DISCONTINUED] cefTRIAXone (ROCEPHIN)  IV  1 g Intravenous Q24H   Infusions:  . heparin 1,450 Units/hr (09/13/12 0654)  . [DISCONTINUED] sodium chloride 100 mL/hr at 09/13/12 9604    Assessment: 62 yo F with hx CAD started on heparin for chest pain with elevated troponin and normal EKG. Heparin level is now therapeutic at 0.47. Plan for radial cath on Monday. No bleeding noted per RN. Slight decrease in CBC.  Goal of Therapy:  Heparin level 0.3-0.7 units/ml Monitor platelets by  anticoagulation protocol: Yes   Plan:  - Continue heparin at 1450 units/hr - F/u with 6 hr heparin level - F/u plans after cath tomorrow  [name], PharmD Clinical Pharmacist Pager: [phone] Phone: [phone] 09/13/2012 4:21 PM

## 2012-09-13 NOTE — Progress Notes (Signed)
Pt. Complaining of shortness of breath that made her wake up.  Upon entering room, pt. Has RR 18, 96% oxygen saturation on RA. Lung sounds clear bilaterally.  Pt. States breathing better now, it was just when she woke up.  Will continue to monitor.

## 2012-09-13 NOTE — Progress Notes (Signed)
ANTICOAGULATION CONSULT NOTE - Follow Up Consult  Pharmacy Consult for Heparin Indication: chest pain/ACS  Heparin Dosing Weight: 74 kg  Labs:  Recent Labs  09/11/12 0555 09/12/12 0540 09/12/12 1255  09/13/12 0525 09/13/12 1330 09/13/12 1952  HGB 12.4  --  12.1  --  11.0*  --   --   HCT 37.5  --  35.7*  --  32.3*  --   --   PLT 353  --  261  --  278  --   --   HEPARINUNFRC 0.25*  --   --   < > 0.21* 0.47 0.59  CREATININE 2.05* 1.16*  --   --  1.13*  --   --   < > = values in this interval not displayed.  Medications:  Infusions:  . sodium chloride    . heparin 1,450 Units/hr (09/13/12 0654)  . [DISCONTINUED] sodium chloride 100 mL/hr at 09/13/12 1610   Assessment:  Heparin level therapeutic, 0.59 at 1450 units/hr No bleeding complications noted   Goal of Therapy:  Heparin level 0.3-0.7 units/ml\   Plan:  Continue Heparin infusion at 1450 units/hr  Daily Heparin level and CBC while on Heparin.    Stramoski, Deetta Perla.D 09/13/2012, 8:39 PM

## 2012-09-14 ENCOUNTER — Other Ambulatory Visit: Payer: Self-pay

## 2012-09-14 ENCOUNTER — Encounter (HOSPITAL_COMMUNITY)
Admission: EM | Disposition: A | Discharge status: Home / Self Care | Payer: Self-pay | Source: Home / Self Care | Attending: Internal Medicine

## 2012-09-14 ENCOUNTER — Inpatient Hospital Stay (HOSPITAL_COMMUNITY): Payer: Medicare Other

## 2012-09-14 HISTORY — PX: LEFT HEART CATHETERIZATION WITH CORONARY ANGIOGRAM: SHX5451

## 2012-09-14 LAB — BASIC METABOLIC PANEL
Calcium: 8.9 mg/dL (ref 8.4–10.5)
GFR calc Af Amer: 69 mL/min — ABNORMAL LOW (ref >90)
GFR calc non Af Amer: 60 mL/min — ABNORMAL LOW (ref >90)
Glucose, Bld: 80 mg/dL (ref 70–99)
Sodium: 141 mEq/L (ref 135–145)

## 2012-09-14 LAB — CBC
MCH: 31.1 pg (ref 26.0–34.0)
Platelets: 326 10*3/uL (ref 150–400)
RBC: 3.7 MIL/uL — ABNORMAL LOW (ref 3.87–5.11)
WBC: 12.9 10*3/uL — ABNORMAL HIGH (ref 4.0–10.5)

## 2012-09-14 LAB — HEPARIN LEVEL (UNFRACTIONATED): Heparin Unfractionated: 0.39 IU/mL (ref 0.30–0.70)

## 2012-09-14 SURGERY — LEFT HEART CATHETERIZATION WITH CORONARY ANGIOGRAM
Anesthesia: LOCAL

## 2012-09-14 MED ORDER — MIDAZOLAM HCL 2 MG/2ML IJ SOLN
INTRAMUSCULAR | Status: AC
  Filled 2012-09-14: qty 2

## 2012-09-14 MED ORDER — PRASUGREL HCL 10 MG PO TABS
10.0000 mg | ORAL_TABLET | Freq: Every day | ORAL | Status: DC
  Administered 2012-09-15 – 2012-09-18 (×4): 10 mg via ORAL
  Filled 2012-09-14 (×4): qty 1

## 2012-09-14 MED ORDER — SODIUM CHLORIDE 0.9 % IV SOLN: 0.2500 mg/kg/h | INTRAVENOUS | Status: AC

## 2012-09-14 MED ORDER — HYDRALAZINE HCL 20 MG/ML IJ SOLN
10.0000 mg | Freq: Once | INTRAMUSCULAR | Status: AC
  Administered 2012-09-14: 10 mg via INTRAVENOUS

## 2012-09-14 MED ORDER — VERAPAMIL HCL 2.5 MG/ML IV SOLN
INTRAVENOUS | Status: AC
  Filled 2012-09-14: qty 2

## 2012-09-14 MED ORDER — HEPARIN (PORCINE) IN NACL 2-0.9 UNIT/ML-% IJ SOLN
INTRAMUSCULAR | Status: AC
  Filled 2012-09-14: qty 1000

## 2012-09-14 MED ORDER — LIDOCAINE HCL (PF) 1 % IJ SOLN
INTRAMUSCULAR | Status: AC
  Filled 2012-09-14: qty 30

## 2012-09-14 MED ORDER — SODIUM CHLORIDE 0.9 % IV SOLN: 250.0000 mL | INTRAVENOUS | Status: DC | PRN

## 2012-09-14 MED ORDER — SODIUM CHLORIDE 0.9 % IJ SOLN: 3.0000 mL | INTRAMUSCULAR | Status: DC | PRN

## 2012-09-14 MED ORDER — HEPARIN SODIUM (PORCINE) 1000 UNIT/ML IJ SOLN
INTRAMUSCULAR | Status: AC
  Filled 2012-09-14: qty 1

## 2012-09-14 MED ORDER — MORPHINE SULFATE 2 MG/ML IJ SOLN
1.0000 mg | INTRAMUSCULAR | Status: DC | PRN
  Administered 2012-09-16 – 2012-09-17 (×2): 1 mg via INTRAVENOUS
  Filled 2012-09-14 (×2): qty 1

## 2012-09-14 MED ORDER — SODIUM CHLORIDE 0.9 % IV SOLN: 1.0000 mL/kg/h | INTRAVENOUS | Status: AC

## 2012-09-14 MED ORDER — FENTANYL CITRATE 0.05 MG/ML IJ SOLN
INTRAMUSCULAR | Status: AC
  Filled 2012-09-14: qty 2

## 2012-09-14 MED ORDER — TICAGRELOR 90 MG PO TABS
ORAL_TABLET | ORAL | Status: AC
  Filled 2012-09-14: qty 2

## 2012-09-14 MED ORDER — SODIUM CHLORIDE 0.9 % IJ SOLN: 3.0000 mL | Freq: Two times a day (BID) | INTRAMUSCULAR | Status: DC

## 2012-09-14 NOTE — Progress Notes (Signed)
I have seen and evaluated the patient this PM along with Nada Boozer, NP.  I agree with her findings, examination as well as impression recommendations.  Recurrent CP with elevated Troponin - plan per Dr. Allyson Sabal is [facility] +/- PCI.  The procedure with Risks/Benefits/Alternatives and Indications was reviewed with the patient.  All questions were answered.    Risks / Complications include, but not limited to: Death, MI, CVA/TIA, VF/VT (with defibrillation), Bradycardia (need for temporary pacer placement), contrast induced nephropathy, bleeding / bruising / hematoma / pseudoaneurysm, vascular or coronary injury (with possible emergent CT or Vascular Surgery), adverse medication reactions, infection.    The patient voices understanding and agree to proceed.  I have signed the consent form and placed it on the chart for patient signature and RN witness.     Marykay Lex, M.D., M.S. [facility] [address] [place], [place]  [address]  [phone]  09/14/2012 6:21 PM

## 2012-09-14 NOTE — Progress Notes (Signed)
[facility] PROGRESS NOTE  [name] RUE:[id number] DOB: July 02, 1951 DOA: 09/09/2012 PCP: No primary provider on file.  Assessment/Plan: 1. Atypical chest pain: resolved now. She had another episode of chest pain 3/7 relieved by nitro. Her enzymes were elevated and cardiology reconsulted. Plan for cath on monday - she had a recent stress test negative for inducible ischemia.  - her EKG does not show any ischemic changes.  - resumed the heparin. Resume aspirin/lipitor and coreg.    2. Acute on CKD stage 2: improving.  her baseline creatinine is 1.05 in 2012. Probably pre renal in view of her hypotension and secondary to ACE I.   Her creatitine on admission is 3.44 and has improved to her baseline.  Renal ultrasound negative for any obstructive causes.  I/O last 3 completed shifts: In: 5059.3 [P.O.:2160; I.V.:2499.3; Other:400] Out: 6600 [Urine:6600] Total I/O In: 0  Out: 200 [Urine:200]   3. Hypokalemia: replete as needed. Magnesium levels within normal limits.   4. CAD S/P stents: now chest pain free . Resume home medications.   5. Anxiety: klonipin prn . Hold for sedation.   6. Leukocytosis: probably reactive. Urine culture negative, cxr neg . procalcitonin is negative. c diff pcr negative  7. Wheezing  resolved: stopped the fluids. As per te patient she has asthma, and is on albuterol which is not on her home medications. But we have started her on albuterol nebs and ordered a cxr. She has good oxygen sats on RA. Her CXR does not show any consolidaton. She is afebrile.   DVT prophylaxis.    Code Status: full code Family Communication: none at bedside Disposition Plan: SNF probably tomorrow,.   Consultants:  cardiology  HPI/Subjective:  unsteady on her feet. PT/OT evaluation ordered recommended SNF placement.  She is curled up in the bed.  She reports she had a better night. No chest pain or  Sob.   Objective: Filed Vitals:   09/13/12 1346 09/13/12 1400  09/13/12 2143 09/14/12 0456  BP:  144/63 146/63 157/92  Pulse:  57 61 66  Temp:  98.2 F (36.8 C) 98.9 F (37.2 C) 98.2 F (36.8 C)  TempSrc:  Oral Oral Oral  Resp:  22 20 19   Height:      Weight:    89.3 kg (196 lb 13.9 oz)  SpO2: 96% 96% 97% 95%    Intake/Output Summary (Last 24 hours) at 09/14/12 0841 Last data filed at 09/14/12 0840  Gross per 24 hour  Intake 2747.55 ml  Output   4850 ml  Net -2102.45 ml   Filed Weights   09/12/12 0500 09/13/12 0600 09/14/12 0456  Weight: 90.4 kg (199 lb 4.7 oz) 90.1 kg (198 lb 10.2 oz) 89.3 kg (196 lb 13.9 oz)    Exam: Alert afebrile comfortable CHEST: good air entry bilateral.  HEART: Regular rate and rhythm. There are no murmur, rub, or gallops.  BACK: No kyphosis or scoliosis; no CVA tenderness  ABDOMEN: soft and non-tender; no masses, no organomegaly, normal abdominal bowel sounds; no pannus; no intertriginous candida. There is no rebound and no distention.  EXTREMITIES: No bone or joint deformity; age-appropriate arthropathy of the hands and knees; no edema; no ulcerations. There is no calf tenderness   Data Reviewed: Basic Metabolic Panel:  Recent Labs Lab 09/10/12 0405 09/10/12 1615 09/11/12 0555 09/12/12 0540 09/13/12 0525 09/14/12 0525  NA 140  --  139 142 140 141  K 2.9* 4.8 4.5 4.0 3.6 4.0  CL 101  --  104 110 105 107  CO2 25  --  22 23 23 23   GLUCOSE 120*  --  116* 105* 93 80  BUN 26*  --  21 12 8 9   CREATININE 3.10*  --  2.05* 1.16* 1.13* 1.00  CALCIUM 9.3  --  9.1 8.9 8.6 8.9  MG  --  2.1  --   --   --   --    Liver Function Tests:  Recent Labs Lab 09/09/12 1803  AST 27  ALT 23  ALKPHOS 129*  BILITOT 0.3  PROT 7.3  ALBUMIN 3.8   No results found for this basename: LIPASE, AMYLASE,  in the last 168 hours No results found for this basename: AMMONIA,  in the last 168 hours CBC:  Recent Labs Lab 09/10/12 0405 09/11/12 0555 09/12/12 1255 09/13/12 0525 09/14/12 0525  WBC 12.4* 17.9* 20.5*  15.2* 12.9*  HGB 14.2 12.4 12.1 11.0* 11.5*  HCT 40.7 37.5 35.7* 32.3* 33.5*  MCV 87.3 92.8 91.3 89.2 90.5  PLT 385 353 261 278 326   Cardiac Enzymes:  Recent Labs Lab 09/10/12 0405 09/10/12 0927 09/11/12 2314 09/12/12 0540 09/12/12 1000  TROPONINI 0.76* 0.84* 1.25* 1.22* 1.28*   BNP (last 3 results) No results found for this basename: PROBNP,  in the last 8760 hours CBG: No results found for this basename: GLUCAP,  in the last 168 hours  Recent Results (from the past 240 hour(s))  URINE CULTURE     Status: None   Collection Time    09/09/12  7:34 PM      Result Value Range Status   Specimen Description URINE, CLEAN CATCH   Final   Special Requests NONE   Final   Culture  Setup Time 09/10/2012 02:51   Final   Colony Count 60,000 COLONIES/ML   Final   Culture     Final   Value: Multiple bacterial morphotypes present, none predominant. Suggest appropriate recollection if clinically indicated.   Report Status 09/11/2012 FINAL   Final  URINE CULTURE     Status: None   Collection Time    09/11/12  9:45 AM      Result Value Range Status   Specimen Description URINE, RANDOM   Final   Special Requests Normal   Final   Culture  Setup Time 09/11/2012 10:00   Final   Colony Count NO GROWTH   Final   Culture NO GROWTH   Final   Report Status 09/12/2012 FINAL   Final  CLOSTRIDIUM DIFFICILE BY PCR     Status: None   Collection Time    09/11/12 12:48 PM      Result Value Range Status   C difficile by pcr NEGATIVE  NEGATIVE Final     Studies: Dg Chest 2 View  09/14/2012  *RADIOLOGY REPORT*  Clinical Data: Wheezing, history of asthma, treat cardiac catheterization  CHEST - 2 VIEW  Comparison: 09/09/2012; 09/02/2012; 04/27/2012  Findings: Grossly unchanged cardiac silhouette and mediastinal contours.  Improved inspiratory effort.  Interval development of ill-defined heterogeneous nodular opacities within the right mid and lower lung.  The left hemithorax is unchanged without focal  airspace opacity.  No pleural effusion or pneumothorax.  Unchanged bones.  IMPRESSION: Interval development of ill-defined slightly nodular heterogeneous opacity within the right mid lung, nonspecific but could be seen in the setting of early infection, including atypical etiologies.  A follow-up chest radiograph in 4 to 6 weeks after treatment is recommended to ensure resolution.   Original  Report Authenticated By: Tacey Ruiz, MD     Scheduled Meds: . aspirin EC  81 mg Oral Daily  . atorvastatin  20 mg Oral q1800  . buPROPion  150 mg Oral Daily  . carvedilol  3.125 mg Oral BID WC  . diazepam  5 mg Oral On Call  . docusate sodium  100 mg Oral BID  . gabapentin  300 mg Oral BID  . pantoprazole  40 mg Oral Daily  . pentosan polysulfate  100 mg Oral Daily  . phenazopyridine  100 mg Oral TID WC  . sodium chloride  3 mL Intravenous Q12H  . sodium chloride  3 mL Intravenous Q12H   Continuous Infusions: . sodium chloride 1 mL/kg/hr (09/13/12 2000)  . heparin 1,450 Units/hr (09/13/12 2238)    Principal Problem:   Chest pain with low risk of acute coronary syndrome; likely musculoskeletal Active Problems:   ARF (acute renal failure)   Hypotension arterial   Elevated troponin   CAD, LAD DES '05, four caths since have shown patent LAD, last cath 11/11   HTN (hypertension), on muiltiple anti hypertensives prior to admission   Chronic pain syndrome   Chronic cystitis   History of CVA , Lt brain    Time spent: 25 min    [facility]  [facility] Pager [phone]. If 7PM-7AM, please contact night-coverage at [email or web address], password [facility] 09/14/2012, 8:41 AM  LOS: 5 days

## 2012-09-14 NOTE — Evaluation (Signed)
Occupational Therapy Evaluation Patient Details Name: KYNDRA CONDRON MRN: [id number] DOB: Oct 05, 1950 Today's Date: 09/14/2012 Time: 1010-1052 OT Time Calculation (min): 42 min  OT Assessment / Plan / Recommendation Clinical Impression  Pt is 62 y/o female dx chest pain, acute CKD, hypokalemia, CAD, anxiety, leukocytosis & wheezing w/ h/o falls. She should benefit from OT acutely to address deficits in ADL's and self care as well as h/o falls. Discussed recommended SNF rehab, however pt currently refusing. If goes home, recommend HHOT, 24hr supervision/assist.    OT Assessment  Patient needs continued OT Services    Follow Up Recommendations  SNF;Supervision/Assistance - 24 hour;Home health OT (HHOT w/ 24hr assist/supervision if declines SNF rehab)    Barriers to Discharge Lives alone plans to d/c home w/ son who works 2 jobs & [address] house    Equipment Recommendations  None recommended by OT    Recommendations for [employer]  Min 2X/week    Precautions / Restrictions Precautions Precautions: Fall Restrictions Weight Bearing Restrictions: No   Pertinent Vitals/Pain Pt w/o c/o pain during assessment and treatment session    ADL  Eating/Feeding: NPO Grooming: Performed;Wash/dry hands;Wash/dry face;Min guard;Supervision/safety Where Assessed - Grooming: Other (comment);Unsupported standing (Standing at sink) Upper Body Bathing: Performed;Chest;Right arm;Left arm;Abdomen;Set up;Other (comment) (washed hair w/ leave in shampoo/shower cap) Where Assessed - Upper Body Bathing: Supported sitting Lower Body Bathing: Performed;Min guard;Set up;Supervision/safety Where Assessed - Lower Body Bathing: Supported sit to stand;Unsupported sitting Upper Body Dressing: Performed;Set up;Supervision/safety Where Assessed - Upper Body Dressing: Unsupported sitting Lower Body Dressing: Performed;Set up;Supervision/safety;Min guard Where Assessed - Lower Body Dressing: Unsupported  sitting;Supported sit to stand Toilet Transfer: Performed;Supervision/safety;Minimal assistance Toilet Transfer Method: Sit to stand Toilet Transfer Equipment: Comfort height toilet;Grab bars Toileting - Clothing Manipulation and Hygiene: Performed;Min guard;Supervision/safety Where Assessed - Toileting Clothing Manipulation and Hygiene: Sit on 3-in-1 or toilet Tub/Shower Transfer Method: Not assessed Equipment Used: Gait belt Transfers/Ambulation Related to ADLs: Pt currently Supervision-Min guard assist for functional mobility and transfers in room, bathroom and at sink. Pt w/ intermittant difficulty following commands, can be implusive at times, cont to monitor cognition. ADL Comments: Pt is 62 y/o female dx chest pain, acute CKD, hypokalemia, CAD, anxiety, leukocytosis & wheezing w/ h/o falls. She demonstrated min guard assist grooming standing at sink, sit-stand and transfers w/ Min A. She performed LB ADL's (B/D) seated @ EOB w/ set up-Min guard assist.     OT Diagnosis: Generalized weakness;Cognitive deficits  OT Problem List: Decreased activity tolerance;Decreased cognition;Decreased safety awareness;Decreased knowledge of use of DME or AE;Cardiopulmonary status limiting activity OT Treatment Interventions: Self-care/ADL training;Energy conservation;DME and/or AE instruction;Therapeutic activities;Patient/family education   OT Goals Acute Rehab OT Goals OT Goal Formulation: Patient unable to participate in goal setting Potential to Achieve Goals: Good ADL Goals Pt Will Perform Grooming: with supervision;Standing at sink ADL Goal: Grooming - Progress: Goal set today Pt Will Perform Lower Body Dressing: with supervision;Sit to stand from chair;Sit to stand from bed;with adaptive equipment ADL Goal: Lower Body Dressing - Progress: Goal set today Pt Will Transfer to Toilet: with supervision;Ambulation;with DME;3-in-1 ADL Goal: Toilet Transfer - Progress: Goal set today Pt Will Perform  Toileting - Clothing Manipulation: with supervision;Standing;Sitting on 3-in-1 or toilet ADL Goal: Toileting - Clothing Manipulation - Progress: Goal set today Pt Will Perform Toileting - Hygiene: with modified independence;Sitting on 3-in-1 or toilet ADL Goal: Toileting - Hygiene - Progress: Goal set today  Visit Information  Last OT Received On: 09/14/12 Assistance  Needed: +1    Subjective Data  Subjective: Pt reports that she plans to return to son's house @ d/c & that he "works two jobs" Patient Stated Goal: D/C from hospital ASAP   Prior Functioning     Home Living Lives With: Alone Available Help at Discharge: Family;Other (Comment) (Son [place] house) Type of Home: House Home Access: Stairs to enter Secretary/administrator of Steps: [address] STE Entrance Stairs-Rails: Left Home Layout: One level Bathroom Shower/Tub: Forensic scientist: Standard Bathroom Accessibility: Yes How Accessible: Accessible via walker Home Adaptive Equipment: None Prior Function Level of Independence: Independent Able to Take Stairs?: Yes Driving: Yes Vocation: On disability Comments: reports holding onto walls due to dizziness, reports several falls since May 2013 (bends over & as coming up gets too dizzy & falls forward) Communication Communication: No difficulties Dominant Hand: Right    Vision/Perception Vision - History Patient Visual Report: No change from baseline   Cognition  Cognition Overall Cognitive Status: Impaired Area of Impairment: Safety/judgement;Awareness of errors Arousal/Alertness: Lethargic Orientation Level: Oriented X4 / Intact Behavior During Session: Lethargic Current Attention Level: Sustained Attention - Other Comments: easily distracted (mostly internally) Safety/Judgement: Decreased safety judgement for tasks assessed;Impulsive;Decreased awareness of need for assistance    Extremity/Trunk Assessment Right Upper Extremity Assessment RUE  ROM/Strength/Tone: Within functional levels;WFL for tasks assessed RUE Coordination: WFL - gross/fine motor Left Upper Extremity Assessment LUE ROM/Strength/Tone: Within functional levels;WFL for tasks assessed LUE Coordination: WFL - gross/fine motor     Mobility Bed Mobility Bed Mobility: Supine to Sit;Sitting - Scoot to Edge of Bed;Sit to Supine Supine to Sit: 5: Supervision;4: Min guard Sitting - Scoot to [place] of Bed: 5: Supervision;4: Min guard Sit to Supine: 5: Supervision Scooting to [facility]: 5: Supervision Details for Bed Mobility Assistance: pt impulsive and moving to EOB despite instuctions to wait Transfers Transfers: Sit to Stand;Stand to Sit Sit to Stand: 4: Min guard;5: Supervision;From bed;From chair/3-in-1;With upper extremity assist Stand to Sit: 4: Min guard;5: Supervision;To bed;To chair/3-in-1;With upper extremity assist Details for Transfer Assistance: Pt implusive & demonstrates difficulty following verbal commands for safety. Decreased awareness reguarding need for assistance.       Balance Balance Balance Assessed: Yes Static Sitting Balance Static Sitting - Balance Support: Feet supported;No upper extremity supported Static Sitting - Level of Assistance: 5: Stand by assistance Static Sitting - Comment/# of Minutes: Pt seated at EOB during ADL's for bathing and dressing Dynamic Sitting Balance Dynamic Sitting - Balance Support: Left upper extremity supported;Feet supported Dynamic Sitting - Level of Assistance: 5: Stand by assistance Static Standing Balance Static Standing - Balance Support: Left upper extremity supported;During functional activity Static Standing - Level of Assistance: 5: Stand by assistance   End of Session OT - End of Session Equipment Utilized During Treatment: Gait belt Activity Tolerance: Patient limited by fatigue Patient left: in bed;with call bell/phone within reach;with bed alarm set  GO     Roselie Awkward Dixon 09/14/2012,  12:28 PM

## 2012-09-14 NOTE — Progress Notes (Signed)
Chart review complete.  Patient is not eligible for [facility] Care Management services because her PCP is not a [facility] primary care provider or is not [facility] affiliated.  For any additional questions or new referrals please contact Tim Henderson BSN RN [facility] Liaison at [phone] °

## 2012-09-14 NOTE — Brief Op Note (Signed)
09/09/2012 - 09/14/2012  8:17 PM  PATIENT:  [name]  62 y.o. female with history of Cypher DES 3.0 mm x 13 mm to prox-mid LAD in 2005.  She presented with NSTEMI, but acute renal failure last week.  Renal function has improved & with recurrence of chest pain, was referred for cardiac cath +/- PCI.  PRE-OPERATIVE DIAGNOSIS:  NSTEMI  POST-OPERATIVE DIAGNOSIS:    100% - likely chronic Total Occlusion of the LAD stent with R-L collaterals  Otherwise minimal luminal irregularities  Normal EF   PROCEDURE:  Procedure(s): LEFT HEART CATHETERIZATION WITH CORONARY ANGIOGRAM (N/A) PCI of prox LAD CTO (100% ISR of previously placed Cypher stent) -- AngioSculpt PTCA --> Xience Expedition 2.75 mm x 33 mm --> post-dilated to 3.2 mm.  SURGEON:  Surgeon(s) and Role:    * Marykay Lex, MD - Primary  ANESTHESIA:   local and IV sedation  - 2ml Lidocaine; 2mg  Versed, 50 mcg Fentanyl.  EBL:    <10 ml  EQUIPMENT: 5 Fr -> 6 Fr Right Radial sheath; TIG 4.0 for L&RCA Angio; Angled Pigtail.  PCI: XB LAD 3.5 , Luge wire, 2.5 mm x 12 mm Predilation --> 2.5 mm x 15 mm Angiosculpt --> Xience DES Stent --> Post-dilated with 3.0 mm x 20 mm [place] balloon   MEDICATIONS USED:    Omnipaque: ;   Radial Cocktail: 5 mg Verapamil, 400 mcg NTG, 2 ml 2% Lidocaine in 10 ml NS  Angiomax Bolus & gtt  -- reduced rate x 2 hr post  Brilinta 180 mg  TR BAND:  1950 hrs, 15 ml --> non-occlusive hemostasis.  DICTATION: .Note written in paper chart and Note written in EPIC  PLAN OF CARE: Monitor Overnight - anticipate d/c in AM  PATIENT DISPOSITION:  PACU - hemodynamically stable.  Dual antiplatelet - indefinitely  Needs to have Cardiology f/u on d/c.  Patient can choose MD.  Primary Service MD not on when case complete --> Covering [facility] MD, Dr. Eben Burow was text paged.   Delay start of Pharmacological VTE agent (>24hrs) due to surgical blood loss or risk of bleeding: not applicable  HARDING,DAVID W, M.D.,  M.S. [facility] [address] [place], [place]  [address]  [phone] Pager # [phone] 09/14/2012 8:33 PM
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
## 2012-09-14 NOTE — Progress Notes (Signed)
Subjective: Pt relates she has occ chest pressure. She is very concerned about not feeling well and if her cardiac cath is ok she would like her neck checked for reasons for pain.  Objective: Vital signs in last 24 hours: Temp:  [98 F (36.7 C)-98.9 F (37.2 C)] 98.7 F (37.1 C) (03/10 1341) Pulse Rate:  [56-66] 56 (03/10 1341) Resp:  [18-20] 18 (03/10 1341) BP: (103-157)/(55-92) 142/55 mmHg (03/10 1341) SpO2:  [90 %-97 %] 90 % (03/10 1341) Weight:  [89.3 kg (196 lb 13.9 oz)] 89.3 kg (196 lb 13.9 oz) (03/10 0456) Weight change: -0.8 kg (-1 lb 12.2 oz) Last BM Date: 09/11/12 Intake/Output from previous day: -1792 03/09 0701 - 03/10 0700 In: 3107.6 [P.O.:1440; I.V.:1267.6] Out: 4900 [Urine:4900] Intake/Output this shift: Total I/O In: 103 [P.O.:100; I.V.:3] Out: 1150 [Urine:1150]  PE: General:alert and oriented Heart:S1S2 RRR Lungs:clear without rales, rhonchi or wheezes Abd:+ BS, soft, non tender Ext:no edema    Lab Results:  Recent Labs  09/13/12 0525 09/14/12 0525  WBC 15.2* 12.9*  HGB 11.0* 11.5*  HCT 32.3* 33.5*  PLT 278 326   BMET  Recent Labs  09/13/12 0525 09/14/12 0525  NA 140 141  K 3.6 4.0  CL 105 107  CO2 23 23  GLUCOSE 93 80  BUN 8 9  CREATININE 1.13* 1.00  CALCIUM 8.6 8.9    Recent Labs  09/12/12 0540 09/12/12 1000  TROPONINI 1.22* 1.28*    Lab Results  Component Value Date   CHOL  Value: 198        ATP III CLASSIFICATION:  <200     mg/dL   Desirable  782-956  mg/dL   Borderline High  >=213    mg/dL   High        08/65/7846   HDL 57 05/19/2010   LDLCALC  Value: 117        Total Cholesterol/HDL:CHD Risk Coronary Heart Disease Risk Table                     Men   Women  1/2 Average Risk   3.4   3.3  Average Risk       5.0   4.4  2 X Average Risk   9.6   7.1  3 X Average Risk  23.4   11.0        Use the calculated Patient Ratio above and the CHD Risk Table to determine the patient's CHD Risk.        ATP III CLASSIFICATION (LDL):  <100      mg/dL   Optimal  962-952  mg/dL   Near or Above                    Optimal  130-159  mg/dL   Borderline  841-324  mg/dL   High  >401     mg/dL   Very High* 02/72/5366   TRIG 122 05/19/2010   CHOLHDL 3.5 05/19/2010   Lab Results  Component Value Date   HGBA1C 5.8* 03/07/2011     Lab Results  Component Value Date   TSH 4.758* 09/09/2012      Studies/Results: Dg Chest 2 View  09/14/2012  *RADIOLOGY REPORT*  Clinical Data: Wheezing, history of asthma, treat cardiac catheterization  CHEST - 2 VIEW  Comparison: 09/09/2012; 09/02/2012; 04/27/2012  Findings: Grossly unchanged cardiac silhouette and mediastinal contours.  Improved inspiratory effort.  Interval development of ill-defined heterogeneous nodular opacities within the right mid  and lower lung.  The left hemithorax is unchanged without focal airspace opacity.  No pleural effusion or pneumothorax.  Unchanged bones.  IMPRESSION: Interval development of ill-defined slightly nodular heterogeneous opacity within the right mid lung, nonspecific but could be seen in the setting of early infection, including atypical etiologies.  A follow-up chest radiograph in 4 to 6 weeks after treatment is recommended to ensure resolution.   Original Report Authenticated By: Tacey Ruiz, MD     Medications: I have reviewed the patient's current medications. Marland Kitchen aspirin EC  81 mg Oral Daily  . atorvastatin  20 mg Oral q1800  . buPROPion  150 mg Oral Daily  . carvedilol  3.125 mg Oral BID WC  . diazepam  5 mg Oral On Call  . docusate sodium  100 mg Oral BID  . gabapentin  300 mg Oral BID  . pantoprazole  40 mg Oral Daily  . pentosan polysulfate  100 mg Oral Daily  . phenazopyridine  100 mg Oral TID WC  . sodium chloride  3 mL Intravenous Q12H  . sodium chloride  3 mL Intravenous Q12H   Assessment/Plan: Principal Problem:   Chest pain with low risk of acute coronary syndrome; likely musculoskeletal Active Problems:   ARF (acute renal failure)   Hypotension  arterial   Elevated troponin   CAD, LAD DES '05, four caths since have shown patent LAD, last cath 11/11   HTN (hypertension), on muiltiple anti hypertensives prior to admission   Chronic pain syndrome   Chronic cystitis   History of CVA , Lt brain  PLAN: cr improved at 1.0, pt trop 1.28, for cath today.     LOS: 5 days   INGOLD,LAURA R 09/14/2012, 4:52 PM

## 2012-09-14 NOTE — Progress Notes (Signed)
ANTICOAGULATION CONSULT NOTE - Follow Up Consult  Pharmacy Consult for Heparin Indication: chest pain/ACS  No Known Allergies  Patient Measurements: Height: 5\' 4"  (162.6 cm) Weight: 196 lb 13.9 oz (89.3 kg) (c scale) IBW/kg (Calculated) : 54.7 Heparin Dosing Weight: 74 kg  Vital Signs: Temp: 98 F (36.7 C) (03/10 1007) Temp src: Oral (03/10 1007) BP: 103/67 mmHg (03/10 1007) Pulse Rate: 56 (03/10 1007)  Labs:  Recent Labs  09/11/12 2314 09/12/12 0540 09/12/12 1000  09/12/12 1255  09/13/12 0525 09/13/12 1330 09/13/12 1952 09/14/12 0525  HGB  --   --   --   < > 12.1  --  11.0*  --   --  11.5*  HCT  --   --   --   --  35.7*  --  32.3*  --   --  33.5*  PLT  --   --   --   --  261  --  278  --   --  326  LABPROT  --   --   --   --   --   --   --   --   --  14.7  INR  --   --   --   --   --   --   --   --   --  1.17  HEPARINUNFRC  --   --   --   --   --   < > 0.21* 0.47 0.59 0.39  CREATININE  --  1.16*  --   --   --   --  1.13*  --   --  1.00  TROPONINI 1.25* 1.22* 1.28*  --   --   --   --   --   --   --   < > = values in this interval not displayed.  Estimated Creatinine Clearance: 63.9 ml/min (by C-G formula based on Cr of 1).   Medications:  Scheduled:  . [COMPLETED] aspirin  324 mg Oral Pre-Cath  . aspirin EC  81 mg Oral Daily  . atorvastatin  20 mg Oral q1800  . buPROPion  150 mg Oral Daily  . carvedilol  3.125 mg Oral BID WC  . diazepam  5 mg Oral On Call  . docusate sodium  100 mg Oral BID  . gabapentin  300 mg Oral BID  . pantoprazole  40 mg Oral Daily  . pentosan polysulfate  100 mg Oral Daily  . phenazopyridine  100 mg Oral TID WC  . sodium chloride  3 mL Intravenous Q12H  . sodium chloride  3 mL Intravenous Q12H   Infusions:  . sodium chloride 1 mL/kg/hr (09/13/12 2000)  . heparin 1,450 Units/hr (09/13/12 2238)  . [DISCONTINUED] sodium chloride 100 mL/hr at 09/13/12 1610    Assessment: 62 yo F with hx CAD continues on heparin for CP/elevated  troponins with plans for cardiac cath today.  Heparin is therapeutic on 1450 units/hr.  Goal of Therapy:  Heparin level 0.3-0.7 units/ml Monitor platelets by anticoagulation protocol: Yes   Plan:  Continue heparin at 1450 units/hr. Will follow-up with results of cardiac cath.  [employer], Pharm.D., BCPS Clinical Pharmacist Pager [phone] 09/14/2012 11:39 AM

## 2012-09-15 LAB — BASIC METABOLIC PANEL
CO2: 22 mEq/L (ref 19–32)
Calcium: 9 mg/dL (ref 8.4–10.5)
Creatinine, Ser: 0.97 mg/dL (ref 0.50–1.10)
GFR calc Af Amer: 72 mL/min — ABNORMAL LOW (ref >90)
GFR calc non Af Amer: 62 mL/min — ABNORMAL LOW (ref >90)
Sodium: 137 mEq/L (ref 135–145)

## 2012-09-15 LAB — CBC
MCV: 89.7 fL (ref 78.0–100.0)
Platelets: 366 10*3/uL (ref 150–400)
RDW: 13.8 % (ref 11.5–15.5)
WBC: 13.3 10*3/uL — ABNORMAL HIGH (ref 4.0–10.5)

## 2012-09-15 LAB — TROPONIN I
Troponin I: 0.62 ng/mL (ref <0.30)
Troponin I: 0.53 ng/mL (ref <0.30)

## 2012-09-15 MED ORDER — HYDRALAZINE HCL 25 MG PO TABS
25.0000 mg | ORAL_TABLET | Freq: Three times a day (TID) | ORAL | Status: DC
  Administered 2012-09-15 – 2012-09-17 (×6): 25 mg via ORAL
  Filled 2012-09-15 (×10): qty 1

## 2012-09-15 MED ORDER — HYDRALAZINE HCL 25 MG PO TABS
25.0000 mg | ORAL_TABLET | Freq: Three times a day (TID) | ORAL | Status: DC
  Filled 2012-09-15 (×3): qty 1

## 2012-09-15 MED ORDER — ISOSORBIDE MONONITRATE ER 30 MG PO TB24
30.0000 mg | ORAL_TABLET | Freq: Every day | ORAL | Status: DC
  Administered 2012-09-15 – 2012-09-17 (×3): 30 mg via ORAL
  Filled 2012-09-15 (×3): qty 1

## 2012-09-15 NOTE — Progress Notes (Signed)
[facility]  Subjective: Complaining of 8/10 CP-"Squeezing" and SOB  Objective: Vital signs in last 24 hours: Temp:  [97.8 F (36.6 C)-99 F (37.2 C)] 97.8 F (36.6 C) (03/11 0806) Pulse Rate:  [46-71] 65 (03/11 0806) Resp:  [18-20] 18 (03/10 1341) BP: (103-199)/(55-91) 176/88 mmHg (03/11 0806) SpO2:  [90 %-100 %] 94 % (03/11 0806) Weight:  [89.6 kg (197 lb 8.5 oz)] 89.6 kg (197 lb 8.5 oz) (03/11 0025) Last BM Date: 09/11/12  Intake/Output from previous day: 03/10 0701 - 03/11 0700 In: 1066.4 [P.O.:340; I.V.:726.4] Out: 4450 [Urine:4450] Intake/Output this shift: Total I/O In: -  Out: 350 [Urine:350]  Medications Current Facility-Administered Medications  Medication Dose Route Frequency Provider Last Rate Last Dose  . albuterol (PROVENTIL HFA;VENTOLIN HFA) 108 (90 BASE) MCG/ACT inhaler 2 puff  2 puff Inhalation Q4H PRN Kathlen Mody, MD   2 puff at 09/14/12 2200  . albuterol (PROVENTIL) (5 MG/ML) 0.5% nebulizer solution 2.5 mg  2.5 mg Nebulization Q2H PRN Kathlen Mody, MD      . aspirin EC tablet 81 mg  81 mg Oral Daily Houston Siren, MD   81 mg at 09/15/12 0904  . atorvastatin (LIPITOR) tablet 20 mg  20 mg Oral q1800 Houston Siren, MD   20 mg at 09/14/12 1749  . buPROPion [facility] SR) 12 hr tablet 150 mg  150 mg Oral Daily Houston Siren, MD   150 mg at 09/15/12 0904  . carvedilol (COREG) tablet 3.125 mg  3.125 mg Oral BID WC Houston Siren, MD   3.125 mg at 09/15/12 0800  . clonazePAM (KLONOPIN) tablet 0.5 mg  0.5 mg Oral BID PRN Kathlen Mody, MD   0.5 mg at 09/14/12 2157  . docusate sodium (COLACE) capsule 100 mg  100 mg Oral BID Houston Siren, MD   100 mg at 09/15/12 0904  . gabapentin (NEURONTIN) capsule 300 mg  300 mg Oral BID Kathlen Mody, MD   300 mg at 09/15/12 0904  . loperamide (IMODIUM) capsule 2 mg  2 mg Oral PRN Kathlen Mody, MD   2 mg at 09/11/12 2038  . morphine 2 MG/ML injection 1 mg  1 mg Intravenous Q1H PRN Marykay Lex, MD      . nitroGLYCERIN  (NITROSTAT) SL tablet 0.4 mg  0.4 mg Sublingual Q5 min PRN Kathlen Mody, MD   0.4 mg at 09/12/12 1325  . ondansetron (ZOFRAN) tablet 4 mg  4 mg Oral Q6H PRN Houston Siren, MD       Or  . ondansetron [facility]) injection 4 mg  4 mg Intravenous Q6H PRN Houston Siren, MD      . oxyCODONE (Oxy IR/ROXICODONE) immediate release tablet 5 mg  5 mg Oral Q4H PRN Kathlen Mody, MD   5 mg at 09/14/12 2336  . pantoprazole (PROTONIX) EC tablet 40 mg  40 mg Oral Daily Houston Siren, MD   40 mg at 09/15/12 0905  . pentosan polysulfate (ELMIRON) capsule 100 mg  100 mg Oral Daily Houston Siren, MD   100 mg at 09/15/12 0904  . phenazopyridine (PYRIDIUM) tablet 100 mg  100 mg Oral TID WC Kathlen Mody, MD   100 mg at 09/15/12 0800  . prasugrel (EFFIENT) tablet 10 mg  10 mg Oral Daily Marykay Lex, MD   10 mg at 09/15/12 0904  . traMADol (ULTRAM) tablet 50 mg  50 mg Oral Q6H PRN Houston Siren, MD   50 mg at 09/14/12 2338    PE: General  appearance: alert, cooperative and no distress Lungs: clear to auscultation bilaterally Heart: regular rate and rhythm, S1, S2 normal, no murmur, click, rub or gallop Pulses: 2+ and symmetric Skin: Warm and dry.  Right wrist:  nontender,  no ecchymosis Neurologic: Grossly normal  Lab Results:   Recent Labs  09/13/12 0525 09/14/12 0525 09/15/12 0525  WBC 15.2* 12.9* 13.3*  HGB 11.0* 11.5* 12.1  HCT 32.3* 33.5* 34.8*  PLT 278 326 366   BMET  Recent Labs  09/13/12 0525 09/14/12 0525 09/15/12 0525  NA 140 141 137  K 3.6 4.0 3.7  CL 105 107 102  CO2 23 23 22   GLUCOSE 93 80 114*  BUN 8 9 8   CREATININE 1.13* 1.00 0.97  CALCIUM 8.6 8.9 9.0   PT/INR  Recent Labs  09/14/12 0525  LABPROT 14.7  INR 1.17   [facility]  CARDIAC CATHETERIZATION AND PERCUTANEOUS CORONARY INTERVENTION REPORT  NAME: [name] MRN: [id number]  DOB: 1950/09/30 ADMIT DATE: 09/09/2012  Procedure Date: 09/15/2012  INTERVENTIONAL CARDIOLOGIST: Marykay Lex, M.D., MS  PRIMARY CARE  PROVIDER: No primary provider on file.  PRIMARY CARDIOLOGIST: [facility]  PATIENT: [name] is a 62 y.o. female with a past medical history significant for CAD, S/P LAD stent (Cypher DES 3.0 mm x 13 mm) in '05 with several caths since showing this site to be patient, th last one was in 2011. She was recently admitted to [facility] in [place] for chest pain. MI was ruled out. She had a negative stress echo. She was discharged 2/27 but presented to her primary care doctors office Fayrene Fearing [place], [place]) 09/09/12 complaining of chest pain and was sent to [facility].  Upon initial evaluation, she was noted to have acute renal failure with a Cr of >3, but was also noted to have elevated Troponins. The initial plan was for medical therapy, but when she had recurrent pain on Saturday with additional increase in Troponin, she was referred for cardiac catheterization.  PRE-OPERATIVE DIAGNOSIS:  NSTEMI  Known CAD PROCEDURES PERFORMED:  Left Heart Catheterization with Native Coronary Angiography  Left Ventriculography  Percutaneous Coronary Intervention with AngioSculpt Angioplasty and Xience Expedition 2.75 mm x 33 mm DES Stent placement to revascularize what has the appearance of a Chronically Occluded LAD with 100% pre-stent stenosis with in-stent restenosis and thrombosis. PROCEDURE:Consent: Risks of procedure as well as the alternatives and risks of each were explained to the (patient/caregiver). Consent for procedure obtained.  Consent for signed by MD and patient with RN witness -- placed on chart.  PROCEDURE: The patient was brought to the 2nd Floor [facility] in the fasting state and prepped and draped in the usual sterile fashion for Right groin access. Sterile technique was used including antiseptics, cap, gloves, gown, hand hygiene, mask and sheet. Skin prep: Chlorhexidine.  Time Out: Verified patient identification, verified procedure, site/side was  marked, verified correct patient position, special equipment/implants available, medications/allergies/relevent history reviewed, required imaging and test results available. Performed  Access: Right Radial Artery; 5 Fr Sheath; Seldinger technique using an Angiocath Micropuncture Kit  10 ml of Radial Cocktail administered via the sheath Diagnostic: TIG 4.0 advanced over a Versicore wire; exchanges made over long-exchange Safety J-wire  Left & Right Coronary Artery Angiography: TIG 4.0  LV Hemodynamics (LV Gram): Angled Pigtail  PCI -- see below  TR Band: 15 ml air at 1950 hrs; non-occlusive hemostasis ANESTHESIA: Local Lidocaine 2 ml  SEDATION: 2 mg IV Versed, 50 mcg IV Fentanyl  MEDICATIONS: Radial Cocktail: 5 mg Verapamil, 400 mcg NTG, 2 ml 2% Lidocaine in 10 ml NS  Omnipaque Contrast:  Anticoagulation: Angiomax bolus and infusion  Anti-Platelet Agent: Brilinta 180 mg  IC Nitroglycerin 200 mcg x 2  Hemodynamics:  Central Aortic / Mean Pressures: 147/75 mmHg; 106 mmHg  Left Ventricular Pressures / EDP: 147/15 mmHg; 22 mmHg Left Ventriculography:  EF: 55-60 %  Wall Motion: no regional abnormalities noted. Coronary Anatomy:  Left Main: Large caliber, mildly calcified vessel that bifurcates into the LAD and Circumflex; angiographically normal. LAD: Large caliber vessel that is sub-totally occluded just proximal to the Cypher stent, followed by extensive in-stent stenosis / thrombosis. After the stent there is trace flow to D1, but TIMI 0 flow beyond the LAD-D2 bifurcation.  There are rPDA septal collaterals to the occlusion site, and the distal LAD fills retrograde apical rPDA-LAD collaterals. Post PCI  D1: small caliber, jailed vessel with ~70% ostial stenosis (stable from 2011)  D2: small caliber vessel, minimal luminal irregularities. Left Circumflex: Large caliber vessel that courses as a Lateral OM with a distal AV-Groove branch. Minimal luminal irregularities.  RCA: Very large  caliber, dominant vessel with several RV marginal branches before the distal bifurcation into rPDA and the Right Posterior AV Groove Branch (RPAV)  RPDA: Moderate to large caliber vessel that reaches the apex and provides collaterals to the distal LAD.  RPL Sysytem:The RPAV is a moderate caliber vessel that gives off several small PL branches, but terminates as a major posterolateral branch. Minimal luminal irregularities. Percutaneous Coronary Intervention: Sheath exchanged for 6 Fr  Guide: 6 Fr XB LAD 3.5  Guidewires:  Luge - advanced with some difficulty, but into D2  Intuition - advanced with balloon support into the distal LAD Predilation Balloon: Sprinter Legend 2.5 mm x 15 mm; 8 Atm x 30 Sec -- 2 inflations at proximal occlusion site and within the stent  Post angioplasty angiography revealed restored flow to the distal LAD, but inadequate dilation. Therefore, a "scoring" balloon was used. AngioSculpt "scoring" Balloon: 2.5 mm x 15 mm; 8 Atm x 35 Sec  Stent: Xience Expedition 2.75 mm x 33 mm; from Proximal LAD across D2.  12 Atm x 30 Sec Post-dilation Balloon: [place] 3.0 mm x 20 mm;  2 overlapping inflations at -- 16 Atm x 60 Sec Final Diameter -- 3.1 mm Post deployment angiography in multiple views with and without guidewire in place revealed excellent stent deployment. The jailed diagonal branches have stable minor ostial stenoses with TIMI 3 flow. TIMI 3 flow was noted throughout the entire LAD. There was no evidence of dissection or perforation.  PATIENT DISPOSITION:  The patient was transferred to the PACU holding area in a hemodynamicaly stable, chest pain free condition.  The patient tolerated the procedure well, and there were no complications. EBL: < 10 ml  The patient was stable before, during, and after the procedure. POST-OPERATIVE DIAGNOSIS:  Severe single vessel disease with likely chronic total occlusion of the proximal LAD.  Successful revascularization with a DES stent  as described.  Preserved EF with surprisingly, no evidence of LAD wall motion abnormality PLAN OF CARE:  Standard Radial Cath care. Transfer to the 6500 post-procedure unit.  Dual Antiplatelet Therapy for a minimum of 1 year, but would strongly consider indefinite coverage  Aggressive Blood Pressure and other RF modification.  Will need to ensure Cardiology Follow-up. Marykay Lex, M.D., M.S.  [facility]    Assessment/Plan  Principal Problem:   Chest pain with low risk of acute coronary syndrome; likely musculoskeletal Active Problems:   ARF (acute renal failure)   Hypotension arterial   Elevated troponin   CAD, LAD DES '05, four caths since have shown patent LAD, last cath 11/11   HTN (hypertension), on muiltiple anti hypertensives prior to admission   Chronic pain syndrome   Chronic cystitis   History of CVA , Lt brain  Plan:  Complaining of 8/10 CP.  Will try one SL NTG but may be anxiety related.  SP left heart cath revealing chronic occlusion of the proximal LAD.  This was revascularized with a DES stent.  EF 55-65% with no WMA by echo.  Net fluids: -5.1L in the last 48 hours without diuretics.   ASA, Effient, coreg, lipitor.  BP elevated.  On low dose Coreg.  HR  46-71.   Will add hydralazine 25mg  Q8hr. Holding off on ACE/ARB due to recent ARF.  Slightly elevated but stable WBCs.  FU CXR recommended by radiology.    LOS: 6 days    HAGER, BRYAN 09/15/2012 9:29 AM

## 2012-09-15 NOTE — Progress Notes (Signed)
BRILINTA COPAY  WILL BE $3.00 Camellia J. Lucretia Roers, RN, BSN, [employer] [phone]

## 2012-09-15 NOTE — Progress Notes (Signed)
Troponin level .62 , relayed to Wilburt Finlay PA

## 2012-09-15 NOTE — Progress Notes (Signed)
Pt. Seen and examined. Agree with the NP/PA-C note as written.  Complaints of 8/10 chest pain today, actually since 12:00 last night - EKG from 4 am shows new lateral TW flattening - unlikely to have had LAD stent go down, but the D1 and D2 ostia were "jailed" and flow may have been compromised to one or both vessels. Agree with uptitrating BP meds. Will add imdur 30 mg daily. Will need to keep until chest pain free. Cycle cardiac enzymes again today.  Chrystie Nose, MD, [facility] Attending Cardiologist [facility]

## 2012-09-15 NOTE — Progress Notes (Addendum)
CARDIAC REHAB PHASE I   PRE:  Rate/Rhythm: 76 SR  BP:  Supine: 176/88  Sitting:  Standing:    SaO2:   MODE:  Ambulation:  ft   POST:  Rate/Rhythm:   BP:  Supine  Sitting:   Standing:    SaO2:  [id number] On arrival pt in bed sleeping. Pt awakened her. Pt c/o of no sleep, refused to get out of bed, states that she has had no sleep. Explained to her that most patients are discharged from this unit early and that her breakfast would be here soon. She continued to refuse to get OOB, but had to use [facility] and assisted her OOB for that. Did completed MI and stent education with her. She admits to a lot of falls and states that she plans to live with her son when she is discharged. Discussed plavix, NTG use, risk factors and walking as tolerated(and safety). She voices understanding, but is forgetful and needs reinforcement of education. It would also be helpful if her son would be present. Reported to RN that I was unable to get  pt to walk with me, she states that her son could not come to get her until 12 noon. States that she is not interested in Outpt. CRP at present, she is not sure where she will be living at.  Melina Copa RN 09/15/2012 8:13 AM

## 2012-09-15 NOTE — Progress Notes (Addendum)
[facility] PROGRESS NOTE  [name] ZOX:[id number] DOB: Aug 10, 1950 DOA: 09/09/2012 PCP: No primary Romaine Neville on file.  Assessment/Plan: 1. chest pain: improved. Her cardiac enzymes were elevated and cardiology reconsulted. She underwent cardiac catheterization on 09/14/12, underwent PCI to LAD. On aspirin, effient and coreg.    2. Acute on CKD stage 2: improving.  her baseline creatinine is 1.05 in 2012. Probably pre renal in view of her hypotension and secondary to ACE I.   Her creatitine on admission is 3.44 and has improved to her baseline.  Renal ultrasound negative for any obstructive causes.  I/O last 3 completed shifts: In: 2412.8 [P.O.:580; I.V.:1832.8] Out: 6800 [Urine:6800] Total I/O In: 120 [P.O.:120] Out: 450 [Urine:450]   3. Hypokalemia: replete as needed. Magnesium levels within normal limits.   4. Anxiety: klonipin prn . Hold for sedation.   6. Leukocytosis: probably reactive. Urine culture negative, cxr neg . procalcitonin is negative. c diff pcr negative.   7. Wheezing  resolved: stopped the fluids. As per te patient she has asthma, and is on albuterol which is not on her home medications. But we have started her on albuterol nebs and ordered a cxr. She has good oxygen sats on RA. Her CXR does not show any consolidaton. She is afebrile.   8. Depression: psychiatry consultation requested.   DVT prophylaxis.    Code Status: full code Family Communication: none at bedside Disposition Plan: SNF probably tomorrow,.   Procedures:  Left Heart Catheterization with Native Coronary Angiography  Left Ventriculography  Percutaneous Coronary Intervention with AngioSculpt Angioplasty and Xience Expedition 2.75 mm x 33 mm DES Stent placement to revascularize what has the appearance of a Chronically Occluded LAD with 100% pre-stent stenosis with in-stent restenosis and thrombosis.   Consultants:  cardiology  HPI/Subjective:  unsteady on her feet. PT/OT  evaluation ordered recommended SNF placement.  She wants to go home.   Objective: Filed Vitals:   09/15/12 0806 09/15/12 1203 09/15/12 1250 09/15/12 1607  BP: 176/88 125/54 154/80 117/88  Pulse: 65 55  80  Temp: 97.8 F (36.6 C) 98 F (36.7 C)  97.9 F (36.6 C)  TempSrc: Oral Oral  Oral  Resp:      Height:      Weight:      SpO2: 94% 96%  95%    Intake/Output Summary (Last 24 hours) at 09/15/12 1818 Last data filed at 09/15/12 1207  Gross per 24 hour  Intake 1083.4 ml  Output   3750 ml  Net -2666.6 ml   Filed Weights   09/13/12 0600 09/14/12 0456 09/15/12 0025  Weight: 90.1 kg (198 lb 10.2 oz) 89.3 kg (196 lb 13.9 oz) 89.6 kg (197 lb 8.5 oz)    Exam: Alert afebrile comfortable CHEST: good air entry bilateral.  HEART: Regular rate and rhythm. There are no murmur, rub, or gallops.  BACK: No kyphosis or scoliosis; no CVA tenderness  ABDOMEN: soft and non-tender; no masses, no organomegaly, normal abdominal bowel sounds; no pannus; no intertriginous candida. There is no rebound and no distention.  EXTREMITIES: No bone or joint deformity; age-appropriate arthropathy of the hands and knees; no edema; no ulcerations. There is no calf tenderness   Data Reviewed: Basic Metabolic Panel:  Recent Labs Lab 09/10/12 1615 09/11/12 0555 09/12/12 0540 09/13/12 0525 09/14/12 0525 09/15/12 0525  NA  --  139 142 140 141 137  K 4.8 4.5 4.0 3.6 4.0 3.7  CL  --  104 110 105 107 102  CO2  --  22 23 23 23 22   GLUCOSE  --  116* 105* 93 80 114*  BUN  --  21 12 8 9 8   CREATININE  --  2.05* 1.16* 1.13* 1.00 0.97  CALCIUM  --  9.1 8.9 8.6 8.9 9.0  MG 2.1  --   --   --   --   --    Liver Function Tests:  Recent Labs Lab 09/09/12 1803  AST 27  ALT 23  ALKPHOS 129*  BILITOT 0.3  PROT 7.3  ALBUMIN 3.8   No results found for this basename: LIPASE, AMYLASE,  in the last 168 hours No results found for this basename: AMMONIA,  in the last 168 hours CBC:  Recent Labs Lab  09/11/12 0555 09/12/12 1255 09/13/12 0525 09/14/12 0525 09/15/12 0525  WBC 17.9* 20.5* 15.2* 12.9* 13.3*  HGB 12.4 12.1 11.0* 11.5* 12.1  HCT 37.5 35.7* 32.3* 33.5* 34.8*  MCV 92.8 91.3 89.2 90.5 89.7  PLT 353 261 278 326 366   Cardiac Enzymes:  Recent Labs Lab 09/11/12 2314 09/12/12 0540 09/12/12 1000 09/15/12 1040 09/15/12 1715  TROPONINI 1.25* 1.22* 1.28* 0.62* 0.59*   BNP (last 3 results) No results found for this basename: PROBNP,  in the last 8760 hours CBG: No results found for this basename: GLUCAP,  in the last 168 hours  Recent Results (from the past 240 hour(s))  URINE CULTURE     Status: None   Collection Time    09/09/12  7:34 PM      Result Value Range Status   Specimen Description URINE, CLEAN CATCH   Final   Special Requests NONE   Final   Culture  Setup Time 09/10/2012 02:51   Final   Colony Count 60,000 COLONIES/ML   Final   Culture     Final   Value: Multiple bacterial morphotypes present, none predominant. Suggest appropriate recollection if clinically indicated.   Report Status 09/11/2012 FINAL   Final  URINE CULTURE     Status: None   Collection Time    09/11/12  9:45 AM      Result Value Range Status   Specimen Description URINE, RANDOM   Final   Special Requests Normal   Final   Culture  Setup Time 09/11/2012 10:00   Final   Colony Count NO GROWTH   Final   Culture NO GROWTH   Final   Report Status 09/12/2012 FINAL   Final  CLOSTRIDIUM DIFFICILE BY PCR     Status: None   Collection Time    09/11/12 12:48 PM      Result Value Range Status   C difficile by pcr NEGATIVE  NEGATIVE Final     Studies: Dg Chest 2 View  09/14/2012  *RADIOLOGY REPORT*  Clinical Data: Wheezing, history of asthma, treat cardiac catheterization  CHEST - 2 VIEW  Comparison: 09/09/2012; 09/02/2012; 04/27/2012  Findings: Grossly unchanged cardiac silhouette and mediastinal contours.  Improved inspiratory effort.  Interval development of ill-defined heterogeneous  nodular opacities within the right mid and lower lung.  The left hemithorax is unchanged without focal airspace opacity.  No pleural effusion or pneumothorax.  Unchanged bones.  IMPRESSION: Interval development of ill-defined slightly nodular heterogeneous opacity within the right mid lung, nonspecific but could be seen in the setting of early infection, including atypical etiologies.  A follow-up chest radiograph in 4 to 6 weeks after treatment is recommended to ensure resolution.   Original Report Authenticated By: Tacey Ruiz, MD  Scheduled Meds: . aspirin EC  81 mg Oral Daily  . atorvastatin  20 mg Oral q1800  . buPROPion  150 mg Oral Daily  . carvedilol  3.125 mg Oral BID WC  . docusate sodium  100 mg Oral BID  . gabapentin  300 mg Oral BID  . hydrALAZINE  25 mg Oral Q8H  . isosorbide mononitrate  30 mg Oral Daily  . pantoprazole  40 mg Oral Daily  . pentosan polysulfate  100 mg Oral Daily  . prasugrel  10 mg Oral Daily   Continuous Infusions:    Principal Problem:   Chest pain with low risk of acute coronary syndrome; likely musculoskeletal Active Problems:   ARF (acute renal failure)   Hypotension arterial   Elevated troponin   CAD, LAD DES '05, four caths since have shown patent LAD, last cath 11/11   HTN (hypertension), on muiltiple anti hypertensives prior to admission   Chronic pain syndrome   Chronic cystitis   History of CVA , Lt brain    Time spent: 25 min    [facility]  [facility] Pager [phone]. If 7PM-7AM, please contact night-coverage at [email or web address], password [facility] 09/15/2012, 6:18 PM  LOS: 6 days

## 2012-09-15 NOTE — Progress Notes (Signed)
TR BAND REMOVAL  LOCATION:  right radial  DEFLATED PER PROTOCOL:  yes  TIME BAND OFF / DRESSING APPLIED:   0100   SITE UPON ARRIVAL:   Level 1  SITE AFTER BAND REMOVAL:  Level 1  REVERSE ALLEN'S TEST:    positive  CIRCULATION SENSATION AND MOVEMENT:  Within Normal Limits  yes  COMMENTS:  Upon arrival site level 1 with edema below the  band to the anterior side of the hand. Dr. Herbie Baltimore aware band deflated per protocol decrease edema with site level 1.

## 2012-09-15 NOTE — CV Procedure (Signed)
[facility] CARDIAC CATHETERIZATION AND PERCUTANEOUS CORONARY INTERVENTION REPORT  NAME:  [name]   MRN: [id number] DOB:  04-29-1951   ADMIT DATE: 09/09/2012 Procedure Date: 09/15/2012   INTERVENTIONAL CARDIOLOGIST: Marykay Lex, M.D., MS PRIMARY CARE PROVIDER: No primary provider on file. PRIMARY CARDIOLOGIST: [facility]  PATIENT:  [name] is a 62 y.o. female with a past medical history significant for CAD, S/P LAD stent (Cypher DES 3.0 mm x 13 mm) in '05 with several caths since showing this site to be patient, th last one was in 2011. She was recently admitted to [facility] in [place] for chest pain. MI was ruled out. She had a negative stress echo. She was discharged 2/27 but presented to her primary care doctors office Fayrene Fearing [place], [place]) 09/09/12 complaining of chest pain and was sent to [facility].  Upon initial evaluation, she was noted to have acute renal failure with a Cr of >3, but was also noted to have elevated Troponins.  The initial plan was for medical therapy, but when she had recurrent pain on Saturday with additional increase in Troponin, she was referred for cardiac catheterization.  PRE-OPERATIVE DIAGNOSIS:    NSTEMI  Known CAD   PROCEDURES PERFORMED:    Left Heart Catheterization with Native Coronary Angiography  Left Ventriculography  Percutaneous Coronary Intervention with AngioSculpt Angioplasty and Xience Expedition 2.75 mm x 33 mm DES Stent placement to revascularize what has the appearance of a Chronically Occluded LAD with 100% pre-stent stenosis with in-stent restenosis and thrombosis.  PROCEDURE:Consent:  Risks of procedure as well as the alternatives and risks of each were explained to the (patient/caregiver).  Consent for procedure obtained. Consent for signed by MD and patient with RN witness -- placed on chart.   PROCEDURE: The patient was brought to the 2nd Floor [facility] in the fasting state and prepped and draped in the usual sterile fashion for Right groin access. Sterile technique was used including antiseptics, cap, gloves, gown, hand hygiene, mask and sheet.  Skin prep: Chlorhexidine.  Time Out: Verified patient identification, verified procedure, site/side was marked, verified correct patient position, special equipment/implants available, medications/allergies/relevent history reviewed, required imaging and test results available.  Performed  Access: Right Radial Artery; 5 Fr Sheath; Seldinger technique using an Angiocath Micropuncture Kit  10 ml of Radial Cocktail administered via the sheath  Diagnostic:  TIG 4.0 advanced over a Versicore wire; exchanges made over long-exchange Safety J-wire  Left & Right Coronary Artery Angiography: TIG 4.0  LV Hemodynamics (LV Gram): Angled Pigtail  PCI -- see below  TR Band: 15 ml air at 1950 hrs; non-occlusive hemostasis  ANESTHESIA:   Local Lidocaine 2 ml SEDATION:  2 mg IV Versed, 50 mcg IV Fentanyl MEDICATIONS: Radial Cocktail: 5 mg Verapamil, 400 mcg NTG, 2 ml 2% Lidocaine in 10 ml NS  Omnipaque Contrast:  Anticoagulation: Angiomax bolus and infusion  Anti-Platelet Agent:  Brilinta 180 mg  IC Nitroglycerin 200 mcg x 2  Hemodynamics:  Central Aortic / Mean Pressures: 147/75 mmHg; 106 mmHg  Left Ventricular Pressures / EDP: 147/15 mmHg; 22 mmHg  Left Ventriculography:  EF: 55-60 %  Wall Motion: no regional abnormalities noted.  Coronary Anatomy:  Left Main: Large caliber, mildly calcified vessel that bifurcates into the LAD and Circumflex; angiographically normal. LAD: Large caliber vessel that is sub-totally occluded just proximal to the Cypher stent, followed by extensive in-stent stenosis / thrombosis.  After  the stent there is trace flow to D1, but TIMI 0 flow beyond the LAD-D2 bifurcation.  There are rPDA septal collaterals to the occlusion site, and the distal  LAD fills retrograde apical rPDA-LAD collaterals. Post PCI  D1: small caliber, jailed vessel with ~70% ostial stenosis (stable from 2011)  D2: small caliber vessel, minimal luminal irregularities. Left Circumflex: Large caliber vessel that courses as a Lateral OM with a distal AV-Groove branch.  Minimal luminal irregularities.   RCA: Very large caliber, dominant vessel with several RV marginal branches before the distal bifurcation into rPDA and the Right Posterior AV Groove Branch (RPAV)  RPDA: Moderate to large caliber vessel that reaches the apex and provides collaterals to the distal LAD.  RPL Sysytem:The RPAV is a moderate caliber vessel that gives off several small PL branches, but terminates as a major posterolateral branch.  Minimal luminal irregularities.  Percutaneous Coronary Intervention:  Sheath exchanged for 6 Fr Guide: 6 Fr   XB LAD 3.5   Guidewires:   Luge - advanced with some difficulty, but into D2   Intuition - advanced with balloon support into the distal LAD  Predilation Balloon: Sprinter Legend 2.5 mm x 15 mm; 8 Atm x 30 Sec -- 2 inflations at proximal occlusion site and within the stent  Post angioplasty angiography revealed restored flow to the distal LAD, but inadequate dilation. Therefore, a "scoring" balloon was used.  AngioSculpt "scoring" Balloon: 2.5 mm x 15 mm; 8 Atm x 35 Sec  Stent: Xience Expedition 2.75  mm x 33 mm; from Proximal LAD across D2.  12 Atm x 30 Sec  Post-dilation Balloon: [place] 3.0 mm x 20 mm;   2 overlapping inflations at  -- 16 Atm x 60 Sec  Final Diameter -- 3.1 mm  Post deployment angiography in multiple views with and without guidewire in place revealed excellent stent deployment.  The jailed diagonal branches have stable minor ostial stenoses with TIMI 3 flow.  TIMI 3 flow was noted throughout the entire LAD.  There was no evidence of dissection or perforation.  PATIENT DISPOSITION:    The patient was transferred to the  PACU holding area in a hemodynamicaly stable, chest pain free condition.  The patient tolerated the procedure well, and there were no complications.  EBL:   < 10 ml  The patient was stable before, during, and after the procedure.  POST-OPERATIVE DIAGNOSIS:    Severe single vessel disease with likely chronic total occlusion of the proximal LAD.  Successful revascularization with a DES stent as described.  Preserved EF with surprisingly, no evidence of LAD wall motion abnormality  PLAN OF CARE:  Standard Radial Cath care. Transfer to the 6500 post-procedure unit.  Dual Antiplatelet Therapy for a minimum of 1 year, but would strongly consider indefinite coverage  Aggressive Blood Pressure and other RF modification.  Will need to ensure Cardiology Follow-up.   Marykay Lex, M.D., M.S. [facility] [address] [place], [place]  [address]  [phone]  09/15/2012 12:57 AM
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
## 2012-09-15 NOTE — Progress Notes (Signed)
Chaplain visited with pt and provided ministry of presence as well as spiritual and emotional support. Pt stated she is feeling much better and should be going home tomorrow.   Chaplain Tacey Ruiz

## 2012-09-15 NOTE — Progress Notes (Signed)
Utilization Review Completed Camellia J. Wood, RN, BSN, NCM [phone]  

## 2012-09-15 NOTE — Clinical Documentation Improvement (Signed)
GENERIC DOCUMENTATION CLARIFICATION QUERY  THIS DOCUMENT IS NOT A PERMANENT PART OF THE MEDICAL RECORD  TO RESPOND TO THE THIS QUERY, FOLLOW THE INSTRUCTIONS BELOW:  1. If needed, update documentation for the patient's encounter via the notes activity.  2. Access this query again and click edit on the In [employer].  3. After updating, or not, click F2 to complete all highlighted (required) fields concerning your review. Select "additional documentation in the medical record" OR "no additional documentation provided".  4. Click Sign note button.  5. The deficiency will fall out of your In Basket *Please let us know if you are not able to complete this workflow by phone or e-mail (listed below).  Please update your documentation within the medical record to reflect your response to this query.                                                                                        09/15/12   Dear Dr. Blake Divine / Associates,  In a better effort to capture your patient's severity of illness/SOI, risk of mortality/ROM, reflect appropriate length of stay/LOS and utilization of resources, a review of the patient medical record has revealed the following indicators.   CV OP REPORT LISTS DIAGNOSIS AS "NSTEMI". IF YOU AGREE PLEASE ADD TO NOTES AND DC SUMMARY. THANK YOU.  Possible Clinical Conditions? - NSTEMI - Other Condition (please specify)    Supporting Information: - Risk Factors: CAD hx DES to prox LAD - Signs & Symptoms: recurrent CP since admit, elevated Troponins then increased Troponins, TW flattening post PCI, per OP Note:"presented with NSTEMI", per CV Note Preop Dx:"NSTEMI" - Diagnostics: LHC showing "subtotalled prox LAD followed by extensive in-stent  stenosis / thrombosis"  - Treatment: LHC w/DES to LAD   You may use possible, probable, or suspect with inpatient documentation. possible, probable, suspected diagnoses MUST be documented at the time of discharge  Reviewed:  additional documentation in the medical record  Thank You,  Beverley Fiedler RN BSN Clinical Documentation Specialist: Tele Contact:  [phone]  Health Information Management [place]

## 2012-09-16 DIAGNOSIS — F3189 Other bipolar disorder: Secondary | ICD-10-CM

## 2012-09-16 DIAGNOSIS — N3020 Other chronic cystitis without hematuria: Secondary | ICD-10-CM

## 2012-09-16 DIAGNOSIS — F329 Major depressive disorder, single episode, unspecified: Secondary | ICD-10-CM

## 2012-09-16 DIAGNOSIS — I214 Non-ST elevation (NSTEMI) myocardial infarction: Secondary | ICD-10-CM | POA: Diagnosis present

## 2012-09-16 DIAGNOSIS — G8929 Other chronic pain: Secondary | ICD-10-CM

## 2012-09-16 LAB — CBC
Platelets: 481 10*3/uL — ABNORMAL HIGH (ref 150–400)
RDW: 14 % (ref 11.5–15.5)
WBC: 15.6 10*3/uL — ABNORMAL HIGH (ref 4.0–10.5)

## 2012-09-16 LAB — BASIC METABOLIC PANEL
Calcium: 10 mg/dL (ref 8.4–10.5)
Chloride: 102 mEq/L (ref 96–112)
Creatinine, Ser: 1.23 mg/dL — ABNORMAL HIGH (ref 0.50–1.10)
GFR calc Af Amer: 54 mL/min — ABNORMAL LOW (ref >90)

## 2012-09-16 MED ORDER — LEVOFLOXACIN 750 MG PO TABS
750.0000 mg | ORAL_TABLET | Freq: Every day | ORAL | Status: DC
  Administered 2012-09-16 – 2012-09-17 (×2): 750 mg via ORAL
  Filled 2012-09-16 (×3): qty 1

## 2012-09-16 MED ORDER — ENOXAPARIN SODIUM 40 MG/0.4ML ~~LOC~~ SOLN
40.0000 mg | SUBCUTANEOUS | Status: DC
  Administered 2012-09-16 – 2012-09-17 (×2): 40 mg via SUBCUTANEOUS
  Filled 2012-09-16 (×3): qty 0.4

## 2012-09-16 MED ORDER — CARVEDILOL 6.25 MG PO TABS
6.2500 mg | ORAL_TABLET | Freq: Two times a day (BID) | ORAL | Status: DC
  Administered 2012-09-16 – 2012-09-17 (×2): 6.25 mg via ORAL
  Filled 2012-09-16 (×4): qty 1

## 2012-09-16 NOTE — Progress Notes (Signed)
[facility] Progress Note [place] TEAM 1 - Stepdown/ICU TEAM   [name]:[id number] DOB: Aug 16, 1950 DOA: 09/09/2012 PCP: No primary provider on file.  Brief narrative: 62 y.o. female with hx of known CAD, s/p 2 cardiac stent placement, recent admission to [facility] for chest pain, where she had a stress echo showing no inducible ischemia, normal left ventricular fx, at 85% of predicted, discharged on Lisinopril at 40mg  per day, presented to her PCP where she was found to be hypotensive with SBP of 90, orthostatic symptomology, and persistent CP. She was sent to [facility] ER. Evaluation in the ER showed EKG without any acute ST-T changes, but her Cr was found to be 3.44 (previously normal Cr), and hypotensive with SBP of 90. She also had elevated troponin to 0.71. Her CXR was clear.   Assessment/Plan:  NSTEMI As per cardiology - no further inpatient workup planned - continue long-acting nitrate - up titrate Coreg  ARF (acute renal failure) Appear to have resolved but creatinine climbing at present - need to reassess in a.m.  Hypotension on admission Resolved - likely due to volume depletion exacerbated by medications  Leukocytosis - abnormal UA  Will treat as urinary tract infection - urine culture not helpful but a negative culture does not rule out an active infection  CAD, LAD DES '05, four caths since have shown patent LAD, last cath 11/11 A repeat cath this admission showing severe single vessel disease with chronic total occlusion of proximal LAD - status post successful revascularization with a drug-eluting stent - dual antiplatelet therapy for one year with strong consideration to be given to indefinite usage - [facility]  History of HTN (hypertension), on muiltiple anti hypertensives prior to admission  Chronic pain syndrome  Chronic Interstitial cystitis  History of CVA , Lt brain  Code Status:  FULL Family Communication: Spoke with patient at bedside Disposition Plan: Tele bed  Consultants: Cardiolgoy - [facility]  Procedures: Cardiac Cath 09/15/2012 - results noted above  Antibiotics: Rocephin 3/5>>3/9 + 3/12 >>  DVT prophylaxis: Lovenox  HPI/Subjective: The patient is resting in bed.  She has refused to work with physical therapy or occupational therapy today.  She denies chest pain at this time.  She denies shortness of breath fevers chills nausea or vomiting.  Objective: Blood pressure 150/78, pulse 90, temperature 97.7 F (36.5 C), temperature source Oral, resp. rate 18, height 5\' 4"  (1.626 m), weight 90.1 kg (198 lb 10.2 oz), SpO2 96.00%.  Intake/Output Summary (Last 24 hours) at 09/16/12 1445 Last data filed at 09/16/12 0626  Gross per 24 hour  Intake    480 ml  Output    200 ml  Net    280 ml    Exam: General: No acute respiratory distress Lungs: Clear to auscultation bilaterally without wheezes or crackles Cardiovascular: Regular rate and rhythm without murmur gallop or rub normal S1 and S2 Abdomen: Overweight, nontender, nondistended, soft, bowel sounds positive, no rebound, no ascites, no appreciable mass Extremities: No significant cyanosis, clubbing, or edema bilateral lower extremities  Data Reviewed: Basic Metabolic Panel:  Recent Labs Lab 09/10/12 1615  09/12/12 0540 09/13/12 0525 09/14/12 0525 09/15/12 0525 09/16/12 0537  NA  --   < > 142 140 141 137 140  K 4.8  < > 4.0 3.6 4.0 3.7 3.8  CL  --   < > 110 105 107 102 102  CO2  --   < > 23 23  23 22 25   GLUCOSE  --   < > 105* 93 80 114* 108*  BUN  --   < > 12 8 9 8 9   CREATININE  --   < > 1.16* 1.13* 1.00 0.97 1.23*  CALCIUM  --   < > 8.9 8.6 8.9 9.0 10.0  MG 2.1  --   --   --   --   --   --   < > = values in this interval not displayed. Liver Function Tests:  Recent Labs Lab 09/09/12 1803  AST 27  ALT 23  ALKPHOS 129*  BILITOT 0.3  PROT 7.3  ALBUMIN 3.8   CBC:  Recent  Labs Lab 09/12/12 1255 09/13/12 0525 09/14/12 0525 09/15/12 0525 09/16/12 0537  WBC 20.5* 15.2* 12.9* 13.3* 15.6*  HGB 12.1 11.0* 11.5* 12.1 13.8  HCT 35.7* 32.3* 33.5* 34.8* 39.6  MCV 91.3 89.2 90.5 89.7 90.4  PLT 261 278 326 366 481*   Cardiac Enzymes:  Recent Labs Lab 09/12/12 0540 09/12/12 1000 09/15/12 1040 09/15/12 1715 09/15/12 2218  TROPONINI 1.22* 1.28* 0.62* 0.59* 0.53*     Recent Results (from the past 240 hour(s))  URINE CULTURE     Status: None   Collection Time    09/09/12  7:34 PM      Result Value Range Status   Specimen Description URINE, CLEAN CATCH   Final   Special Requests NONE   Final   Culture  Setup Time 09/10/2012 02:51   Final   Colony Count 60,000 COLONIES/ML   Final   Culture     Final   Value: Multiple bacterial morphotypes present, none predominant. Suggest appropriate recollection if clinically indicated.   Report Status 09/11/2012 FINAL   Final  URINE CULTURE     Status: None   Collection Time    09/11/12  9:45 AM      Result Value Range Status   Specimen Description URINE, RANDOM   Final   Special Requests Normal   Final   Culture  Setup Time 09/11/2012 10:00   Final   Colony Count NO GROWTH   Final   Culture NO GROWTH   Final   Report Status 09/12/2012 FINAL   Final  CLOSTRIDIUM DIFFICILE BY PCR     Status: None   Collection Time    09/11/12 12:48 PM      Result Value Range Status   C difficile by pcr NEGATIVE  NEGATIVE Final     Studies:  Recent x-ray studies have been reviewed in detail by the Attending Physician  Scheduled Meds:  Scheduled Meds: . aspirin EC  81 mg Oral Daily  . atorvastatin  20 mg Oral q1800  . buPROPion  150 mg Oral Daily  . carvedilol  6.25 mg Oral BID WC  . docusate sodium  100 mg Oral BID  . gabapentin  300 mg Oral BID  . hydrALAZINE  25 mg Oral Q8H  . isosorbide mononitrate  30 mg Oral Daily  . pantoprazole  40 mg Oral Daily  . pentosan polysulfate  100 mg Oral Daily  . prasugrel  10 mg  Oral Daily   Continuous Infusions:   Time spent on care of this patient: 6   [facility]  [facility] Office  [phone] Pager - Text Page per Loretha Stapler as per below:  On-Call/Text Page:      [email or web address]      password [email or web address]  If 7PM-7AM, please contact night-coverage [email or web address] Password [facility] 09/16/2012,  2:45 PM   LOS: 7 days

## 2012-09-16 NOTE — Progress Notes (Signed)
Subjective:  In good spirits, one episode of chest pain last night, better with NTG.  Objective:  Vital Signs in the last 24 hours: Temp:  [97.7 F (36.5 C)-98.3 F (36.8 C)] 97.7 F (36.5 C) (03/12 0900) Pulse Rate:  [55-90] 90 (03/12 0626) BP: (117-154)/(54-89) 150/78 mmHg (03/12 0626) SpO2:  [93 %-96 %] 96 % (03/12 0626) Weight:  [90.1 kg (198 lb 10.2 oz)] 90.1 kg (198 lb 10.2 oz) (03/12 0001)  Intake/Output from previous day:  Intake/Output Summary (Last 24 hours) at 09/16/12 1610 Last data filed at 09/16/12 9604  Gross per 24 hour  Intake    600 ml  Output    300 ml  Net    300 ml    Physical Exam: General appearance: alert, cooperative, no distress and moderately obese Lungs: clear to auscultation bilaterally Heart: regular rate and rhythm   Rate: 88  Rhythm: normal sinus rhythm  Lab Results:  Recent Labs  09/15/12 0525 09/16/12 0537  WBC 13.3* 15.6*  HGB 12.1 13.8  PLT 366 481*    Recent Labs  09/15/12 0525 09/16/12 0537  NA 137 140  K 3.7 3.8  CL 102 102  CO2 22 25  GLUCOSE 114* 108*  BUN 8 9  CREATININE 0.97 1.23*    Recent Labs  09/15/12 1715 09/15/12 2218  TROPONINI 0.59* 0.53*   Hepatic Function Panel No results found for this basename: PROT, ALBUMIN, AST, ALT, ALKPHOS, BILITOT, BILIDIR, IBILI,  in the last 72 hours No results found for this basename: CHOL,  in the last 72 hours  Recent Labs  09/14/12 0525  INR 1.17    Imaging: Imaging results have been reviewed  Cardiac Studies:  Assessment/Plan:   Principal Problem:   NSTEMI (non-ST elevated myocardial infarction) Active Problems:   CAD, LAD DES '05, four caths since have shown patent LAD, last cath 11/11   Chronic pain syndrome   ARF (acute renal failure)- resolved   Hypotension on admission, resolved   HTN (hypertension), on muiltiple anti hypertensives prior to admission   Chronic cystitis   History of CVA , Lt brain   Plan- Possible discharge this afternoon,  MD to see. Increase Coreg to 6.25mg  BID   Corine Shelter PA-C 09/16/2012, 9:27 AM

## 2012-09-16 NOTE — Consult Note (Signed)
Patient Identification:  [name] Date of Evaluation:  09/16/2012 Reason for Consult:   Depression  Referring [name]: Dr. Donette Larry  History of Present Illness: [name] is an 62 y.o. female with hx of known CAD, s/p 2 cardiac stent placement, recent admission to [facility] for chest pain, where she had a stress echo showing no inducible ischemia, normal left ventricular fx, at 85% of predicted, discharged on Lisinopril at 40mg  per day, presents to her PCP where she was found to be hypotensive with SBP of 90, orthostatic symptomology, and persistent CP. She was sent to [facility] ER. Evaluation in the ER showed EKG without any acute ST-T changes, but her Cr was found to be 3.44 (previously normal Cr), and hypotensive with SBP of 90. She also has elevated troponin to 0.71. Her CXR was clear.  She has had a stent replaced.   Past Psychiatric History:Pt states that she had a progressive downward turn of stability and mood.  She began having falls at home May 2013, July 2013, She lost appetitie and could not do her ADLs.  She just slept and ate.  Two weeks ago she was taking a sponge bath.  Later son, philip, cam be  He took one look and took her to the ED.   She had been taking nitroglycerin.  She could not stand, could not walk.  At ED  They found 1 of her two cardiac stents had collapsed.  She felt dizzy and had a mild stroke.      Past Medical History:     Past Medical History  Diagnosis Date  . CAD in native artery   . HTN (hypertension), benign   . Hyperlipidemia   . Depression   . Chronic back pain   . GERD (gastroesophageal reflux disease)   . Myocardial infarction   . Anginal pain   . Anxiety   . Asthma   . Pneumonia   . Peripheral vascular disease   . Neuromuscular disorder   . Headache   . Arthritis   . Fibromyalgia   . Anemia        Past Surgical History  Procedure Laterality Date  . Hernia repair    . Abdominal hysterectomy      Allergies: No Known  Allergies  Current Medications:  Prior to Admission medications   Medication Sig Start Date End Date Taking? Authorizing Maxi Carreras  aspirin EC 81 MG tablet Take 81 mg by mouth daily.   Yes Historical Sherma Vanmetre, MD  buPROPion (WELLBUTRIN SR) 150 MG 12 hr tablet Take 150 mg by mouth daily.   Yes Historical Shealeigh Dunstan, MD  carvedilol (COREG) 3.125 MG tablet Take 3.125 mg by mouth 2 (two) times daily with a meal.   Yes Historical Aamani Moose, MD  clonazePAM (KLONOPIN) 0.5 MG tablet Take 0.5 mg by mouth 2 (two) times daily.   Yes Historical Fransisco Messmer, MD  clorazepate (TRANXENE) 15 MG tablet Take 15 mg by mouth 2 (two) times daily.   Yes Historical Raiquan Chandler, MD  gabapentin (NEURONTIN) 600 MG tablet Take 600 mg by mouth 3 (three) times daily.   Yes Historical Evander Macaraeg, MD  lisinopril (PRINIVIL,ZESTRIL) 40 MG tablet Take 40 mg by mouth daily.   Yes Historical Saavi Mceachron, MD  naproxen (NAPROSYN) 500 MG tablet Take 500 mg by mouth 2 (two) times daily with a meal.   Yes Historical Edmon Magid, MD  nitroGLYCERIN (NITROSTAT) 0.4 MG SL tablet Place 0.4 mg under the tongue every 5 (five) minutes as needed for  chest pain.   Yes Historical Donelda Mailhot, MD  omeprazole (PRILOSEC) 40 MG capsule Take 40 mg by mouth 2 (two) times daily.   Yes Historical Rainbow Salman, MD  oxyCODONE (OXYCONTIN) 10 MG 12 hr tablet Take 10 mg by mouth every 6 (six) hours.   Yes Historical Reathel Turi, MD  pentosan polysulfate (ELMIRON) 100 MG capsule Take 100 mg by mouth daily.   Yes Historical Asanti Craigo, MD  traMADol (ULTRAM) 50 MG tablet Take 50 mg by mouth every 6 (six) hours as needed for pain.   Yes Historical Jing Howatt, MD    Social History:    reports that she has never smoked. She has never used smokeless tobacco. She reports that she does not drink alcohol or use illicit drugs.   Family History:    Family History  Problem Relation Age of Onset  . CAD Mother   . CAD Brother     Mental Status Examination/Evaluation: Objective:  Appearance:  Casual and obese with Akathisia or TD of lower extremities; non-stop movement  Eye Contact::  Good  Speech:  Clear and Coherent and Normal Rate  Volume:  Normal  Mood:  Calm, engaaged  Affect:  Appropriate and Congruent  Thought Process:  Coherent, Goal Directed and Logical  Orientation:  Full (Time, Place, and Person)  Thought Content:  Pt tends to defer, deny symptoms without reporting medical problems e. g. movement of legs  Suicidal Thoughts:  No  Homicidal Thoughts:  No  Judgement:  Fair  Insight:  Fair   DIAGNOSIS:   AXIS I  Bipolar Diorder, II Depressed, Chronic Pain  AXIS II  Deferred  AXIS III See medical notes.  AXIS IV other psychosocial or environmental problems, problems related to social environment, problems with access to health care services and pt lacks initiative to see MD when she falls, symptoms appear or change  AXIS V 41-50 serious symptoms   Assessment/Plan:  Dr. Donette Larry,  Psych CSW Pt has prior CAD with two Stents in place; she continued to dismiss repeated falls until son took her to ED to discover she needs a new stent to replace a collapsed one.  She now has restless leg syndrome, akathiaa or chronic tardive dyskinesia [less responsive to treatment].  She cannot say when this began, except 'some time ago;.  The  Benztropine 2 mg twice daily may be tried  Or Ropinerol ? Dose for restless legs.  The medication most problematic is the Tramadol which with can interfere with antidepressant medications.  She has both pain and depression.  A substitution of Tramadol is suggested.  She is very attached to her grandson [name] whom she raised for several years.  He visits father and pt on scheduled arrangement.  She wanted to tell dreams of her deceased mother who said she was 'fine' .  RECOMMENDATION:  1.  Pt has capacity but unfortunately lacks initiative to see a PCP when new or recurring symptoms appear.    She will benefit from psycho-education about her medical  treatment regimen and need to report.  2.  Suggest substitution of Tramadol which can cause interactions with anti-depressants, especially SSRIs She may be taking tooo much pain medication.  3.  May need SNF, noted she declines PT before she should return home 4.  No further psychiatric needs unless requested  PHYLLIS BOGARD MD 09/16/2012 10:54 AM

## 2012-09-16 NOTE — Progress Notes (Signed)
Noted pt declined PT ambulation. Could not prioritize pt over other pts who were agreeable to ambulation. Will f/u tomorrow if no d/c. Ethelda Chick CES, ACSM

## 2012-09-16 NOTE — Progress Notes (Signed)
Encouraged pt to ambulate , pt refused. Claimed will do it in the morning.

## 2012-09-16 NOTE — Clinical Social Work Psychosocial (Addendum)
    Clinical Social Work Department BRIEF PSYCHOSOCIAL ASSESSMENT 09/16/2012  Patient:  [name], [name]     Account Number:  [id number]     Admit date:  09/09/2012  Clinical Social Worker:  Tiburcio Pea  Date/Time:  09/11/2012 05:00 PM  Referred by:  Physician  Date Referred:  09/10/2012 Referred for  SNF Placement   Other Referral:   Interview type:  Patient Other interview type:    PSYCHOSOCIAL DATA Living Status:  WITH ADULT CHILDREN Admitted from facility:   Level of care:   Primary support name:  Phillip [place]   [phone] Primary support relationship to patient:  CHILD, ADULT Degree of support available:   Supportive    CURRENT CONCERNS Current Concerns  Post-Acute Placement   Other Concerns:   PT recommends short term SNF. Patient refuses.    SOCIAL WORK ASSESSMENT / PLAN CSW met with patient today 2 discuss physical therapy's recommendation for short term SNF.  Patient is alert and oriented, very pleasant lady who lives at home with her son. She plans to return to her son's home but states she will NOT agreee to placement.  Her son works during the day and CSW discussed concerns about her being alone during the day but she remains adamant that she will not consider short term SNF.  She will agree to [facility] PT, RN, Aide and DME. This was discussed with RNCM.  CSW will sign off but will be available should she change her mind.   Assessment/plan status:  Psychosocial Support/Ongoing Assessment of Needs Other assessment/ plan:   Information/referral to community resources:   SNF bed list discussed and offered- patient defers  Discussed home health services    PATIENT'S/FAMILY'S RESPONSE TO PLAN OF CARE: Patient adamantly refuses SNF despite PT recommendations. She plans home with Home Health. She is aware of difficulties of being alone during the day but feels that she will "manage" at home.  CSW will sign off but will be available if d/c disposition  changes. RNCM is aware of above.

## 2012-09-16 NOTE — Progress Notes (Addendum)
Pt declined ambulation. Sts she wants to walk around 1 pm when she is "woke up". Could not convince to walk now with increased encouragement. Sts she is getting warm right now. Ethelda Chick CES, ACSM 09/16/2012 9:49 AM

## 2012-09-16 NOTE — Progress Notes (Addendum)
Pt. Seen and examined. Agree with the NP/PA-C note as written.  Chest pain has improved, on long-acting nitrate. Troponin is trending down. Remains hypertensive, I agree with up-titrating her coreg. Leukocytosis is worse today and creatinine is climbing, however, may be settling back to baseline. Seems to be stable from a cardiac standpoint. Will sign-off and arrange follow-up in our office. Call with questions.  Chrystie Nose, MD, [facility] Attending Cardiologist [facility]

## 2012-09-16 NOTE — Progress Notes (Signed)
PT Cancellation Note  Patient Details Name: [name] MRN: [id number] DOB: 11/08/1950   Cancelled Treatment:    Reason Eval/Treat Not Completed: Other (comment) ("go Away" pt reports d/c home today)   Greggory Stallion 09/16/2012, 11:58 AM

## 2012-09-17 DIAGNOSIS — I951 Orthostatic hypotension: Secondary | ICD-10-CM | POA: Diagnosis present

## 2012-09-17 LAB — BASIC METABOLIC PANEL
CO2: 19 mEq/L (ref 19–32)
CO2: 23 mEq/L (ref 19–32)
Chloride: 99 mEq/L (ref 96–112)
GFR calc non Af Amer: 47 mL/min — ABNORMAL LOW (ref >90)
Glucose, Bld: 120 mg/dL — ABNORMAL HIGH (ref 70–99)
Potassium: 3.7 mEq/L (ref 3.5–5.1)
Sodium: 136 mEq/L (ref 135–145)
Sodium: 137 mEq/L (ref 135–145)

## 2012-09-17 LAB — CBC
Platelets: 503 10*3/uL — ABNORMAL HIGH (ref 150–400)
RBC: 4.09 MIL/uL (ref 3.87–5.11)
WBC: 11.4 10*3/uL — ABNORMAL HIGH (ref 4.0–10.5)

## 2012-09-17 MED ORDER — ATORVASTATIN CALCIUM 20 MG PO TABS: 20.0000 mg | ORAL_TABLET | Freq: Every day | ORAL | Status: DC

## 2012-09-17 MED ORDER — HYDRALAZINE HCL 25 MG PO TABS: 25.0000 mg | ORAL_TABLET | Freq: Three times a day (TID) | ORAL | Status: DC

## 2012-09-17 MED ORDER — CARVEDILOL 6.25 MG PO TABS
6.2500 mg | ORAL_TABLET | Freq: Two times a day (BID) | ORAL | Status: DC
  Administered 2012-09-17: 6.25 mg via ORAL
  Filled 2012-09-17 (×4): qty 1

## 2012-09-17 MED ORDER — ISOSORBIDE MONONITRATE ER 30 MG PO TB24: 30.0000 mg | ORAL_TABLET | Freq: Every day | ORAL | Status: DC

## 2012-09-17 MED ORDER — DSS 100 MG PO CAPS: 100.0000 mg | ORAL_CAPSULE | Freq: Two times a day (BID) | ORAL | Status: DC

## 2012-09-17 MED ORDER — GABAPENTIN 300 MG PO CAPS: 300.0000 mg | ORAL_CAPSULE | Freq: Two times a day (BID) | ORAL | Status: DC

## 2012-09-17 MED ORDER — SODIUM CHLORIDE 0.9 % IV SOLN
INTRAVENOUS | Status: DC
  Administered 2012-09-17: 17:00:00 via INTRAVENOUS

## 2012-09-17 MED ORDER — IBUPROFEN 400 MG PO TABS
400.0000 mg | ORAL_TABLET | Freq: Once | ORAL | Status: AC
  Administered 2012-09-17: 17:00:00 400 mg via ORAL
  Filled 2012-09-17: qty 1

## 2012-09-17 MED ORDER — PRASUGREL HCL 10 MG PO TABS: 10.0000 mg | ORAL_TABLET | Freq: Every day | ORAL | Status: DC

## 2012-09-17 MED ORDER — ISOSORBIDE MONONITRATE ER 30 MG PO TB24
30.0000 mg | ORAL_TABLET | Freq: Every day | ORAL | Status: DC
  Administered 2012-09-18: 30 mg via ORAL
  Filled 2012-09-17: qty 1

## 2012-09-17 MED ORDER — ACETAMINOPHEN-CODEINE #4 300-60 MG PO TABS: 1.0000 | ORAL_TABLET | ORAL | Status: DC | PRN

## 2012-09-17 NOTE — ED Provider Notes (Signed)
I saw and evaluated the patient, reviewed the resident's note and I agree with the findings and plan.  Please see completed note.   Raeford Razor, MD 09/17/12 [phone]

## 2012-09-17 NOTE — Progress Notes (Addendum)
Discharge was planned and d/c summary completed. However, Pt was not discharged today as planned due to orthostatic hypotension. Now being hydrated via IV.  Will add cortisol level Check ESR for tenderness in left scalp and headache.  Will follow in AM  Calvert Cantor, MD [phone]

## 2012-09-17 NOTE — Progress Notes (Addendum)
CARDIAC REHAB PHASE I   PRE:  Rate/Rhythm: 69 SR  BP:  Supine: 123/76  Sitting:   Standing:    SaO2:   MODE:  Ambulation: 1000 ft   POST:  Rate/Rhythm: 92 SR  BP:  Supine: 139/78  Sitting:   Standing:    SaO2:  1610-9604 On arrival pt in bed sleeping, awakened her, she was very reluctant to walk. I was able to convince her to ambulate. Assisted X 1 to ambulate. Gait a little wobbly. Pt has had multiple falls at home. Would recommend Home Health RN and Physical Therapy. Pt states that she had started [facility] coming to her home, she had two visits from her, [facility] out of [place]. Pt was living in [place], but her plan is to stay with her son at discharge he lives in [place]. Would also recommend walker and BSC for home use. Pt states that she can not always control her bladder and she could not ruin her son's carpet. Pt tolerated ambulation well , denies any cp or SOB. VS stable. Pt back to bed after walk, stating that she is not a morning person. Pt also admits to forgetting her medications so, she needs medication box set up by [facility] and son's assistance to remember them.  Melina Copa RN 09/17/2012 8:40 AM

## 2012-09-17 NOTE — Progress Notes (Signed)
Physical Therapy Treatment Patient Details Name: [name] MRN: [id number] DOB: 1950/12/19 Today's Date: 09/17/2012 Time: 0840-0900 PT Time Calculation (min): 20 min  PT Assessment / Plan / Recommendation Comments on Treatment Session  Pt demonstrated Independence today with mobility on the unit without the use of an assistive device. Pt has a history of impulsive behavior and decreased safety awareness. Pt became noticable anxious and agitated at times when asked to try the steps and also when looking for her ear plugs in the room. She started throwing her bed linens all over the floor. Spoke with pt's cardiac rehab therapist  who reported the patient mobility varies when she is medicated.  Pt is Independent at this time and is pending possible d/c home today. Recommend follow-up HHPT to reinforce safety with mobility and to see if pt remains Independent based on history of falls possibly due to impulsive behavior and medication.      Follow Up Recommendations  Home health PT     Does the patient have the potential to tolerate intense rehabilitation     Barriers to Discharge        Equipment Recommendations  Rolling walker with 5" wheels    Recommendations for Other Services    Frequency     Plan Discharge plan needs to be updated    Precautions / Restrictions Precautions Precautions: Fall   Pertinent Vitals/Pain     Mobility  Bed Mobility Bed Mobility: Supine to Sit Supine to Sit: 7: Independent Sitting - Scoot to Edge of Bed: 7: Independent Sit to Supine: 7: Independent Scooting to [facility]: 7: Independent Details for Bed Mobility Assistance: pt is impulsive, but was able to safely complete bed mobility without difficulty. Transfers Transfers: Sit to Stand Sit to Stand: 7: Independent Stand to Sit: 7: Independent Stand Pivot Transfers: 7: Independent Details for Transfer Assistance: Pt stood without difficulty. Pt reported she is walking around in the room without  assistance. Ambulation/Gait Ambulation/Gait Assistance: 7: Independent Ambulation Distance (Feet): 100 Feet Assistive device: None Ambulation/Gait Assistance Details: Pt was able to ambulate on the unit without difficulty, Independent with no device. Pt became very anxious when she saw the stairs in the stairwelll and reported she is afraid.  Gait Pattern: Within Functional Limits Gait velocity: good Stairs: No    Exercises     PT Diagnosis:    PT Problem List:   PT Treatment Interventions:     PT Goals Acute Rehab PT Goals PT Goal: Supine/Side to Sit - Progress: Met PT Goal: Sit to Stand - Progress: Met PT Transfer Goal: Bed to Chair/Chair to Bed - Progress: Met PT Goal: Ambulate - Progress: Met  Visit Information  Last PT Received On: 09/17/12    Subjective Data  Subjective: I just walked. Patient Stated Goal: to go home   Cognition  Cognition Overall Cognitive Status: Appears within functional limits for tasks assessed/performed Arousal/Alertness: Awake/alert Orientation Level: Appears intact for tasks assessed Behavior During Session: Anxious    Balance  Balance Balance Assessed: Yes Static Sitting Balance Static Sitting - Balance Support: No upper extremity supported Static Sitting - Level of Assistance: 7: Independent Dynamic Sitting Balance Dynamic Sitting - Balance Support: No upper extremity supported Dynamic Sitting - Level of Assistance: 7: Independent Static Standing Balance Static Standing - Balance Support: No upper extremity supported Static Standing - Level of Assistance: 7: Independent  End of Session PT - End of Session Equipment Utilized During Treatment: Gait belt Activity Tolerance: Patient tolerated treatment well Patient  left: in bed;with call bell/phone within reach Nurse Communication: Mobility status   GP     Greggory Stallion 09/17/2012, 9:50 AM

## 2012-09-17 NOTE — Discharge Summary (Addendum)
Physician Discharge Summary  [name] RUE:[id number] DOB: Jan 25, 1951 DOA: 09/09/2012  PCP: No primary provider on file.  Admit date: 09/09/2012 Discharge date: 09/17/2012  Time spent: >45 minutes  Recommendations for Outpatient Follow-up:  1. Pt attempting to find new PCP 2. Resume ACE I if needed as outpt after reviewing renal function 3. Referral to pain specialist  4. Further evaluation of left sided scalp tenderness and headache  Discharge Diagnoses:  Principal Problem:   NSTEMI (non-ST elevated myocardial infarction) Active Problems:   ARF (acute renal failure)- resolved   Hypotension on admission, resolved   CAD, LAD DES '05, four caths since have shown patent LAD, last cath 11/11   HTN (hypertension), on muiltiple anti hypertensives prior to admission   Chronic pain syndrome   Chronic cystitis   History of CVA , Lt brain Frequent falls  Discharge Condition: stable- pt to go home to live with son   Diet recommendation: heart healthy  Filed Weights   09/15/12 0025 09/16/12 0001 09/17/12 0016  Weight: 89.6 kg (197 lb 8.5 oz) 90.1 kg (198 lb 10.2 oz) 90.7 kg (199 lb 15.3 oz)    History of present illness:  62 y.o. female with hx of known CAD, s/p 2 cardiac stent placement, recent admission to [facility] for chest pain, where she had a stress echo showing no inducible ischemia, normal left ventricular fx, at 85% of predicted, discharged on Lisinopril at 40mg  per day, presented to her PCP where she was found to be hypotensive with SBP of 90, orthostatic symptomology, and persistent CP. She was sent to [facility] ER. Evaluation in the ER showed EKG without any acute ST-T changes, but her Cr was found to be 3.44 (previously normal Cr), and hypotensive with SBP of 90. She also had elevated troponin to 0.71. A recent stress myoview was negative.     Hospital Course:  NSTEMI  - Initial cardiac evaluation was done on 3/6. It was suspected that elevated troponins ware  non-significant and likely from ARF. # sets of Troponin readings remained in the .7-.8 range. Chest pain was atypical in nature and reproducible with palpation.  - Pt had a repeat episode of chest pain on 3/7 which was relieved with Nitro. Troponins were noted to rise. To 1.25 and then to 1.28. At this point, cardiology decided she would need a cardiac cath. This was performed on 3/11.  Left Heart Cath:  Severe single vessel disease with likely chronic total occlusion of the proximal LAD. Successful revascularization with a DES stent -The jailed diagonal branches have stable minor ostial stenoses with TIMI 3 flow. TIMI 3 flow was noted throughout the entire LAD.  Preserved EF with surprisingly, no evidence of LAD wall motion abnormality   ARF (acute renal failure)  -Suspected to be due to hypotension, ACE I and dehydration - Cr continues to be elevated above her baseline at (baseline being 0.9-1- currently at 1/22) -ACE I continues to be on hold  - will allow cardiology to decide on when to resume  Hypotension on admission with history of HTN -Resolved - likely due to volume depletion exacerbated by medications  - med rec has been re-done today prior to d/c  -Cardiology has adjusted medications- see d/c med list - As mentioned above ACE I on hold  Leukocytosis - abnormal UA  UA on 3/5 reveal large leukocytes with few bacteria- nitrites negative- culture with no growth Was treated with Rocephin for 3 days    Chronic pain syndrome -Pt  takes Tramadol at home for pain and tells me it is ineffective  - Unfortunately due to interaction with Wellbutrin, the psychiatrist has advised to d/c Tramadol  - She is receiving Neurontin now which is helping with pain in her feet - she continues to have a left sided headache and tenderness which she complains of today but tells me has been present ever since she fell and hit her head a while back.  It is not mentioned in any notes throughout her hospital  stay.   Mood Instability - A psych consult was requested and Dr Ferol Luz asking for Tramadol to be discontinued as it can interfere with antidepressants.  - other medications have not been changed  Frequent falls Pt will receive home health PT/ OT and has been provided with a rolling walker  Consultations:  Cardiology   Psychiatry   Discharge Exam: Filed Vitals:   09/16/12 2319 09/17/12 0016 09/17/12 0400 09/17/12 0740  BP: 168/95  170/101   Pulse: 80  80   Temp: 98.1 F (36.7 C)  97.5 F (36.4 C) 98.2 F (36.8 C)  TempSrc: Oral  Oral Oral  Resp:      Height:      Weight:  90.7 kg (199 lb 15.3 oz)    SpO2: 95%  95%     General: alert, oriented x 3, no distress HEENT: tender left scalp and face Cardiovascular: RRR, no murmurs Respiratory: CTA b/l  Abd: soft, NT, ND, BS+ Ext: no c/c/e  Discharge Instructions     Medication List    STOP taking these medications       cloNIDine 0.1 MG tablet  Commonly known as:  CATAPRES     lisinopril 40 MG tablet  Commonly known as:  PRINIVIL,ZESTRIL     metoprolol 100 MG tablet  Commonly known as:  LOPRESSOR     traMADol 50 MG tablet  Commonly known as:  ULTRAM      TAKE these medications       acetaminophen 500 MG tablet  Commonly known as:  TYLENOL  Take 1,000 mg by mouth every 6 (six) hours as needed for pain.     amLODipine 10 MG tablet  Commonly known as:  NORVASC  Take 10 mg by mouth daily.     aspirin EC 81 MG tablet  Take 81 mg by mouth daily.     atorvastatin 20 MG tablet  Commonly known as:  LIPITOR  Take 1 tablet (20 mg total) by mouth daily at 6 PM.     buPROPion 150 MG 12 hr tablet  Commonly known as:  WELLBUTRIN SR  Take 150 mg by mouth daily.     carvedilol 3.125 MG tablet  Commonly known as:  COREG  Take 3.125 mg by mouth 2 (two) times daily with a meal.     DSS 100 MG Caps  Take 100 mg by mouth 2 (two) times daily.     gabapentin 300 MG capsule  Commonly known as:  NEURONTIN  Take  1 capsule (300 mg total) by mouth 2 (two) times daily.     hydrALAZINE 25 MG tablet  Commonly known as:  APRESOLINE  Take 1 tablet (25 mg total) by mouth every 8 (eight) hours.     isosorbide mononitrate 30 MG 24 hr tablet  Commonly known as:  IMDUR  Take 1 tablet (30 mg total) by mouth daily.     miconazole 2 % vaginal cream  Commonly known as:  MONISTAT 7  Place 1 applicator vaginally daily as needed (for vaginal).     naphazoline-pheniramine 0.025-0.3 % ophthalmic solution  Commonly known as:  NAPHCON-A  Place 1 drop into both eyes 4 (four) times daily as needed (dry eyes).     nitroGLYCERIN 0.4 MG SL tablet  Commonly known as:  NITROSTAT  Place 0.4 mg under the tongue every 5 (five) minutes as needed for chest pain.     omeprazole 20 MG capsule  Commonly known as:  PRILOSEC  Take 20 mg by mouth 2 (two) times daily.     PARoxetine 20 MG tablet  Commonly known as:  PAXIL  Take 20 mg by mouth every morning.     prasugrel 10 MG Tabs  Commonly known as:  EFFIENT  Take 1 tablet (10 mg total) by mouth daily.       Follow-up Information   Follow up with [facility] K, PA-C. (office will call)    Contact information:   [address] [place] [address] [phone]        The results of significant diagnostics from this hospitalization (including imaging, microbiology, ancillary and laboratory) are listed below for reference.    Significant Diagnostic Studies: Dg Chest 2 View  09/14/2012  *RADIOLOGY REPORT*  Clinical Data: Wheezing, history of asthma, treat cardiac catheterization  CHEST - 2 VIEW  Comparison: 09/09/2012; 09/02/2012; 04/27/2012  Findings: Grossly unchanged cardiac silhouette and mediastinal contours.  Improved inspiratory effort.  Interval development of ill-defined heterogeneous nodular opacities within the right mid and lower lung.  The left hemithorax is unchanged without focal airspace opacity.  No pleural effusion or pneumothorax.   Unchanged bones.  IMPRESSION: Interval development of ill-defined slightly nodular heterogeneous opacity within the right mid lung, nonspecific but could be seen in the setting of early infection, including atypical etiologies.  A follow-up chest radiograph in 4 to 6 weeks after treatment is recommended to ensure resolution.   Original Report Authenticated By: Tacey Ruiz, MD    US Renal  09/10/2012  *RADIOLOGY REPORT*  Clinical Data: Acute on chronic renal disease.  RENAL/URINARY TRACT ULTRASOUND COMPLETE  Comparison:  CT abdomen and pelvis 03/09/2011.  Findings:  Right Kidney:  Measures 10.0 cm and appears normal without stone, mass or hydronephrosis.  Left Kidney:  Measures 11.3 cm and appears normal without stone, mass or hydronephrosis.  Bladder:  Unremarkable.  IMPRESSION: Negative exam.   Original Report Authenticated By: Holley Dexter, M.D.    Dg Chest Portable 1 View  09/09/2012  *RADIOLOGY REPORT*  Clinical Data: Hypotension, chest pain, shortness of breath, dizziness  PORTABLE CHEST - 1 VIEW  Comparison: 09/02/2012  Findings: Heart size upper limits normal for technique. There are low lung volumes with resultant crowding of bronchovascular structures.  No focal infiltrate.  No overt edema.  No effusion. Degenerative spurring in bilateral shoulders.  IMPRESSION:  1.  Low volumes.  No acute disease.   Original Report Authenticated By: D. Andria Rhein, MD     Microbiology: Recent Results (from the past 240 hour(s))  URINE CULTURE     Status: None   Collection Time    09/09/12  7:34 PM      Result Value Range Status   Specimen Description URINE, CLEAN CATCH   Final   Special Requests NONE   Final   Culture  Setup Time 09/10/2012 02:51   Final   Colony Count 60,000 COLONIES/ML   Final   Culture     Final   Value: Multiple  bacterial morphotypes present, none predominant. Suggest appropriate recollection if clinically indicated.   Report Status 09/11/2012 FINAL   Final  URINE CULTURE      Status: None   Collection Time    09/11/12  9:45 AM      Result Value Range Status   Specimen Description URINE, RANDOM   Final   Special Requests Normal   Final   Culture  Setup Time 09/11/2012 10:00   Final   Colony Count NO GROWTH   Final   Culture NO GROWTH   Final   Report Status 09/12/2012 FINAL   Final  CLOSTRIDIUM DIFFICILE BY PCR     Status: None   Collection Time    09/11/12 12:48 PM      Result Value Range Status   C difficile by pcr NEGATIVE  NEGATIVE Final     Labs: Basic Metabolic Panel:  Recent Labs Lab 09/10/12 1615  09/14/12 0525 09/15/12 0525 09/16/12 0537 09/17/12 0620 09/17/12 0902  Jerra Huckeby  --   < > 141 137 140 136 137  K 4.8  < > 4.0 3.7 3.8 SPECIMEN CONTAMINATED, UNABLE TO PERFORM TEST(S). 3.7  CL  --   < > 107 102 102 99 101  CO2  --   < > 23 22 25 19 23   GLUCOSE  --   < > 80 114* 108* 94 120*  BUN  --   < > 9 8 9 12 13   CREATININE  --   < > 1.00 0.97 1.23* 1.12* 1.22*  CALCIUM  --   < > 8.9 9.0 10.0 SPECIMEN CONTAMINATED, UNABLE TO PERFORM TEST(S). 9.5  MG 2.1  --   --   --   --   --   --   < > = values in this interval not displayed. Liver Function Tests: No results found for this basename: AST, ALT, ALKPHOS, BILITOT, PROT, ALBUMIN,  in the last 168 hours No results found for this basename: LIPASE, AMYLASE,  in the last 168 hours No results found for this basename: AMMONIA,  in the last 168 hours CBC:  Recent Labs Lab 09/13/12 0525 09/14/12 0525 09/15/12 0525 09/16/12 0537 09/17/12 0620  WBC 15.2* 12.9* 13.3* 15.6* 11.4*  HGB 11.0* 11.5* 12.1 13.8 13.0  HCT 32.3* 33.5* 34.8* 39.6 35.5*  MCV 89.2 90.5 89.7 90.4 86.8  PLT 278 326 366 481* 503*   Cardiac Enzymes:  Recent Labs Lab 09/12/12 1000 09/15/12 1040 09/15/12 1715 09/15/12 2218 09/17/12 0620  TROPONINI 1.28* 0.62* 0.59* 0.53* <0.30   BNP: BNP (last 3 results) No results found for this basename: PROBNP,  in the last 8760 hours CBG: No results found for this basename:  GLUCAP,  in the last 168 hours     Signed:  RIZWAN,SAIMA  [facility] 09/17/2012, 1:24 PM

## 2012-09-17 NOTE — Care Management Note (Signed)
    Page 1 of 2   09/17/2012     8:53:31 AM   CARE MANAGEMENT NOTE 09/17/2012  Patient:  [name], [name]   Account Number:  [id number]  Date Initiated:  09/11/2012  Documentation initiated by:  Tera Mater  Subjective/Objective Assessment:   62yo female admitted with Chest Pain.  Pt. lives at home alone.     Action/Plan:   Discharge planning   Anticipated DC Date:  09/17/2012   Anticipated DC Plan:  HOME W HOME HEALTH SERVICES      DC Planning Services  CM consult      PAC Choice  DURABLE MEDICAL EQUIPMENT  HOME HEALTH   Choice offered to / List presented to:  C-1 Patient   DME arranged  BEDSIDE COMMODE  WALKER      DME agency  [facility]     [facility] arranged  HH-1 RN  HH-10 DISEASE MANAGEMENT      HH agency  [facility]   Status of service:  Completed, signed off Medicare Important Message given?   (If response is "NO", the following Medicare IM given date fields will be blank) Date Medicare IM given:   Date Additional Medicare IM given:    Discharge Disposition:    Per UR Regulation:  Reviewed for med. necessity/level of care/duration of stay  If discussed at Long Length of Stay Meetings, dates discussed:    Comments:  09/17/12 0830  Oletta Cohn, RN, BSN, [place] [phone] Spoke with pt and son concerning discharge planning. Offered choice for [facility] services. Pt chose [facility] to render services.  Lupita Leash of [facility] contacted.  Pt also requests DME bedside commode and walker.  Jill Alexanders of [facility] aware.    09/11/12 1415 Noted new CM order to obtain PCP for pt.  Pt. has a PCP [facility] Wilson.  PT recommends SNF.  Will make CSW Lupita Leash aware. Tera Mater, RN, BSN NCM [phone]

## 2012-09-18 LAB — BASIC METABOLIC PANEL
BUN: 18 mg/dL (ref 6–23)
Chloride: 107 mEq/L (ref 96–112)
GFR calc Af Amer: 53 mL/min — ABNORMAL LOW (ref >90)
Glucose, Bld: 93 mg/dL (ref 70–99)
Potassium: 3.8 mEq/L (ref 3.5–5.1)
Sodium: 141 mEq/L (ref 135–145)

## 2012-09-18 MED ORDER — CARVEDILOL 3.125 MG PO TABS
3.1250 mg | ORAL_TABLET | Freq: Two times a day (BID) | ORAL | Status: DC
  Administered 2012-09-18: 10:00:00 via ORAL

## 2012-09-18 MED ORDER — DEXTROMETHORPHAN HBR 15 MG/5ML PO SYRP: 10.0000 mL | ORAL_SOLUTION | Freq: Four times a day (QID) | ORAL | Status: DC | PRN

## 2012-09-18 MED ORDER — ALBUTEROL SULFATE HFA 108 (90 BASE) MCG/ACT IN AERS: 2.0000 | INHALATION_SPRAY | Freq: Four times a day (QID) | RESPIRATORY_TRACT | Status: AC | PRN

## 2012-09-18 MED ORDER — ZOLPIDEM TARTRATE 5 MG PO TABS: 5.0000 mg | ORAL_TABLET | Freq: Every evening | ORAL | Status: DC | PRN

## 2012-10-04 IMAGING — CR DG CHEST 1V
1 series · 1 of 1 positions shown · non-contrast
Comparison: Two-view chest 03/07/2011.

CLINICAL DATA: Chest pain and soreness.

CHEST - 1 VIEW

[w chest pa]
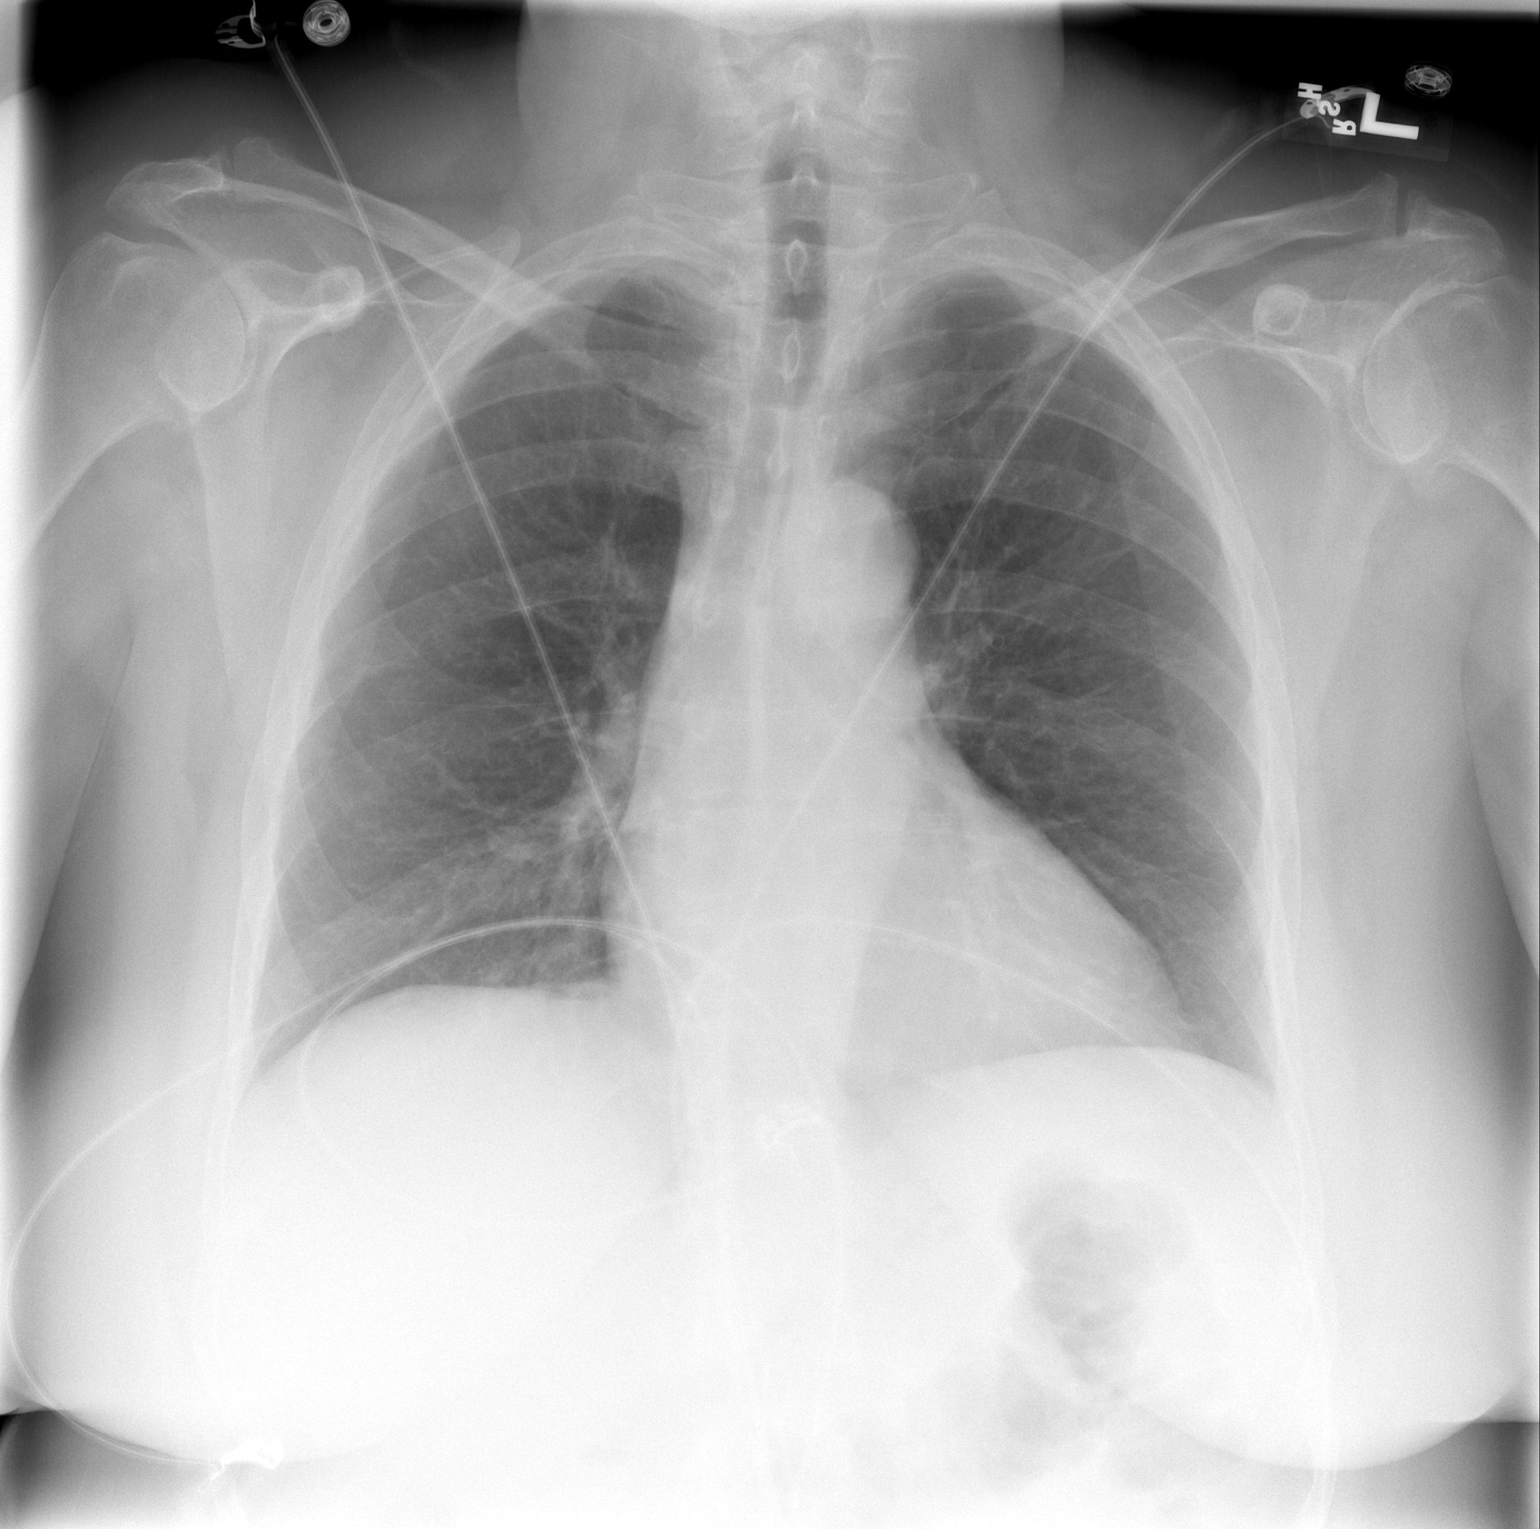

[1 of 1 positions shown; findings below may reference images not displayed]

FINDINGS: The lung volumes are improved.  Mild cardiomegaly
persists.  No focal airspace disease is evident.  The the the
visualized soft tissues and bony thorax are unremarkable.
IMPRESSION: 1.  Improved lung volumes without evidence for residual airspace
disease or vascular congestion.
2.  Borderline cardiomegaly without failure.

## 2012-10-25 ENCOUNTER — Emergency Department (HOSPITAL_COMMUNITY)
Admission: EM | Admit: 2012-10-25 | Discharge: 2012-10-25 | Disposition: A | Discharge status: Home / Self Care | Payer: Medicare Other | Attending: Emergency Medicine | Admitting: Emergency Medicine

## 2012-10-25 ENCOUNTER — Encounter (HOSPITAL_COMMUNITY): Payer: Self-pay

## 2012-10-25 DIAGNOSIS — Z8701 Personal history of pneumonia (recurrent): Secondary | ICD-10-CM | POA: Insufficient documentation

## 2012-10-25 DIAGNOSIS — J45909 Unspecified asthma, uncomplicated: Secondary | ICD-10-CM | POA: Insufficient documentation

## 2012-10-25 DIAGNOSIS — Z862 Personal history of diseases of the blood and blood-forming organs and certain disorders involving the immune mechanism: Secondary | ICD-10-CM | POA: Insufficient documentation

## 2012-10-25 DIAGNOSIS — M549 Dorsalgia, unspecified: Secondary | ICD-10-CM | POA: Insufficient documentation

## 2012-10-25 DIAGNOSIS — I10 Essential (primary) hypertension: Secondary | ICD-10-CM | POA: Insufficient documentation

## 2012-10-25 DIAGNOSIS — Z79899 Other long term (current) drug therapy: Secondary | ICD-10-CM | POA: Insufficient documentation

## 2012-10-25 DIAGNOSIS — Z8669 Personal history of other diseases of the nervous system and sense organs: Secondary | ICD-10-CM | POA: Insufficient documentation

## 2012-10-25 DIAGNOSIS — R109 Unspecified abdominal pain: Secondary | ICD-10-CM | POA: Insufficient documentation

## 2012-10-25 DIAGNOSIS — R209 Unspecified disturbances of skin sensation: Secondary | ICD-10-CM | POA: Insufficient documentation

## 2012-10-25 DIAGNOSIS — N390 Urinary tract infection, site not specified: Secondary | ICD-10-CM | POA: Insufficient documentation

## 2012-10-25 DIAGNOSIS — R300 Dysuria: Secondary | ICD-10-CM | POA: Insufficient documentation

## 2012-10-25 DIAGNOSIS — Z7982 Long term (current) use of aspirin: Secondary | ICD-10-CM | POA: Insufficient documentation

## 2012-10-25 DIAGNOSIS — F329 Major depressive disorder, single episode, unspecified: Secondary | ICD-10-CM | POA: Insufficient documentation

## 2012-10-25 DIAGNOSIS — F3289 Other specified depressive episodes: Secondary | ICD-10-CM | POA: Insufficient documentation

## 2012-10-25 DIAGNOSIS — I252 Old myocardial infarction: Secondary | ICD-10-CM | POA: Insufficient documentation

## 2012-10-25 DIAGNOSIS — Z8673 Personal history of transient ischemic attack (TIA), and cerebral infarction without residual deficits: Secondary | ICD-10-CM | POA: Insufficient documentation

## 2012-10-25 DIAGNOSIS — I2510 Atherosclerotic heart disease of native coronary artery without angina pectoris: Secondary | ICD-10-CM | POA: Insufficient documentation

## 2012-10-25 DIAGNOSIS — Z8739 Personal history of other diseases of the musculoskeletal system and connective tissue: Secondary | ICD-10-CM | POA: Insufficient documentation

## 2012-10-25 DIAGNOSIS — G8929 Other chronic pain: Secondary | ICD-10-CM | POA: Insufficient documentation

## 2012-10-25 DIAGNOSIS — Z8679 Personal history of other diseases of the circulatory system: Secondary | ICD-10-CM | POA: Insufficient documentation

## 2012-10-25 DIAGNOSIS — E785 Hyperlipidemia, unspecified: Secondary | ICD-10-CM | POA: Insufficient documentation

## 2012-10-25 HISTORY — DX: Cerebral infarction, unspecified: I63.9

## 2012-10-25 LAB — CBC WITH DIFFERENTIAL/PLATELET
Eosinophils Absolute: 0.8 10*3/uL — ABNORMAL HIGH (ref 0.0–0.7)
Hemoglobin: 12.2 g/dL (ref 12.0–15.0)
Lymphocytes Relative: 22 % (ref 12–46)
Lymphs Abs: 2.4 10*3/uL (ref 0.7–4.0)
MCH: 30.4 pg (ref 26.0–34.0)
Monocytes Relative: 6 % (ref 3–12)
Neutrophils Relative %: 65 % (ref 43–77)
Platelets: 428 10*3/uL — ABNORMAL HIGH (ref 150–400)
RBC: 4.01 MIL/uL (ref 3.87–5.11)
WBC: 11.2 10*3/uL — ABNORMAL HIGH (ref 4.0–10.5)

## 2012-10-25 LAB — URINALYSIS, ROUTINE W REFLEX MICROSCOPIC
Bilirubin Urine: NEGATIVE
Hgb urine dipstick: NEGATIVE
Nitrite: NEGATIVE
Specific Gravity, Urine: 1.015 (ref 1.005–1.030)
pH: 6 (ref 5.0–8.0)

## 2012-10-25 LAB — POCT I-STAT, CHEM 8
BUN: 22 mg/dL (ref 6–23)
Chloride: 107 mEq/L (ref 96–112)
Creatinine, Ser: 0.9 mg/dL (ref 0.50–1.10)
Glucose, Bld: 110 mg/dL — ABNORMAL HIGH (ref 70–99)
Potassium: 3.7 mEq/L (ref 3.5–5.1)
Sodium: 142 mEq/L (ref 135–145)

## 2012-10-25 LAB — URINE MICROSCOPIC-ADD ON

## 2012-10-25 MED ORDER — TRAMADOL HCL 50 MG PO TABS
50.0000 mg | ORAL_TABLET | Freq: Once | ORAL | Status: DC
  Filled 2012-10-25: qty 1

## 2012-10-25 MED ORDER — TRAMADOL HCL 50 MG PO TABS: 50.0000 mg | ORAL_TABLET | Freq: Four times a day (QID) | ORAL | Status: DC | PRN

## 2012-10-25 MED ORDER — KETOROLAC TROMETHAMINE 30 MG/ML IJ SOLN
30.0000 mg | Freq: Once | INTRAMUSCULAR | Status: DC
  Filled 2012-10-25: qty 1

## 2012-10-25 MED ORDER — SULFAMETHOXAZOLE-TMP DS 800-160 MG PO TABS
1.0000 | ORAL_TABLET | Freq: Once | ORAL | Status: AC
  Administered 2012-10-25: 1 via ORAL
  Filled 2012-10-25: qty 1

## 2012-10-25 MED ORDER — KETOROLAC TROMETHAMINE 30 MG/ML IJ SOLN
30.0000 mg | Freq: Once | INTRAMUSCULAR | Status: AC
  Administered 2012-10-25: 30 mg via INTRAMUSCULAR

## 2012-10-25 MED ORDER — SULFAMETHOXAZOLE-TMP DS 800-160 MG PO TABS: 1.0000 | ORAL_TABLET | Freq: Two times a day (BID) | ORAL | Status: DC

## 2012-10-25 NOTE — ED Notes (Signed)
Pt. C/o abdominal pain and leg pain/ Pt. States "I have interstitial cystitis and neuropathy and I just couldn't take the pain anymore".

## 2012-10-25 NOTE — ED Notes (Signed)
C/o lower abd pain since yesterday. Also c/o leg pain from her neuropathy

## 2012-10-25 NOTE — ED Provider Notes (Signed)
History     CSN: [id number]  Arrival date & time 10/25/12  0227   First MD Initiated Contact with Patient 10/25/12 [phone]      Chief Complaint  Patient presents with  . Abdominal Pain  . Dysuria    (Consider location/radiation/quality/duration/timing/severity/associated sxs/prior treatment) HPI Comments: Patient with a history of interstitial cystitis, and chronic lower abdominal pain, and perineal, and upper thigh.  Neuropathy, states, that she has been in constant pain for years, but it's worse over the last 24 hours.  She notes increased stress, and arguing with her son, whom she lives with she also states she has dysuria.  Denies fever, diarrhea, change in appetite, states she's been taking her medication.  She is just tired of hurting.  She is to take tramadol for discomfort, which Work for her but after her discharge from the hospital in March with an MI, and renal, insufficiency, they've did not renew her tramadol  Patient is a 62 y.o. female presenting with abdominal pain and dysuria. The history is provided by the patient.  Abdominal Pain Pain location:  Suprapubic, LLQ and RLQ Pain quality: aching   Pain radiates to:  Does not radiate Pain severity:  Moderate Onset quality:  Gradual Timing:  Constant Progression:  Worsening Chronicity:  Chronic Associated symptoms: dysuria   Associated symptoms: no diarrhea, no fever, no nausea and no vomiting   Dysuria  Pertinent negatives include no nausea, no vomiting, no frequency and no flank pain.    Past Medical History  Diagnosis Date  . CAD in native artery   . HTN (hypertension), benign   . Hyperlipidemia   . Depression   . Chronic back pain   . GERD (gastroesophageal reflux disease)   . Myocardial infarction   . Anginal pain   . Anxiety   . Asthma   . Pneumonia   . Peripheral vascular disease   . Neuromuscular disorder   . Headache   . Arthritis   . Fibromyalgia   . Anemia   . Stroke     Past Surgical History   Procedure Laterality Date  . Hernia repair    . Abdominal hysterectomy      Family History  Problem Relation Age of Onset  . CAD Mother   . CAD Brother     History  Substance Use Topics  . Smoking status: Never Smoker   . Smokeless tobacco: Never Used  . Alcohol Use: No    OB History   Grav Para Term Preterm Abortions TAB SAB Ect Mult Living                  Review of Systems  Constitutional: Negative for fever.  Gastrointestinal: Positive for abdominal pain. Negative for nausea, vomiting and diarrhea.  Genitourinary: Positive for dysuria. Negative for frequency and flank pain.  Musculoskeletal: Negative for myalgias.  Neurological: Positive for numbness.  All other systems reviewed and are negative.    Allergies  Ciprofloxacin hcl and Food  Home Medications   Current Outpatient Rx  Name  Route  Sig  Dispense  Refill  . albuterol (PROVENTIL HFA;VENTOLIN HFA) 108 (90 BASE) MCG/ACT inhaler   Inhalation   Inhale 2 puffs into the lungs every 6 (six) hours as needed for wheezing.   1 Inhaler   2   . aspirin EC 81 MG tablet   Oral   Take 81 mg by mouth daily.         Marland Kitchen atorvastatin (LIPITOR) 20 MG tablet  Oral   Take 1 tablet (20 mg total) by mouth daily at 6 PM.   30 tablet   0   . buPROPion (WELLBUTRIN SR) 150 MG 12 hr tablet   Oral   Take 150 mg by mouth daily.         . carvedilol (COREG) 3.125 MG tablet   Oral   Take 3.125 mg by mouth 2 (two) times daily with a meal.         . dextromethorphan 15 MG/5ML syrup   Oral   Take 10 mLs (30 mg total) by mouth 4 (four) times daily as needed for cough.   120 mL   0   . hydrALAZINE (APRESOLINE) 25 MG tablet   Oral   Take 1 tablet (25 mg total) by mouth every 8 (eight) hours.   90 tablet   0   . isosorbide mononitrate (IMDUR) 30 MG 24 hr tablet   Oral   Take 1 tablet (30 mg total) by mouth daily.   30 tablet   0   . Multiple Vitamin (MULTIVITAMIN WITH MINERALS) TABS   Oral   Take 1  tablet by mouth daily.         . nitroGLYCERIN (NITROSTAT) 0.4 MG SL tablet   Sublingual   Place 0.4 mg under the tongue every 5 (five) minutes as needed for chest pain.         . pentosan polysulfate (ELMIRON) 100 MG capsule   Oral   Take 100 mg by mouth daily as needed (for pain).         . prasugrel (EFFIENT) 10 MG TABS   Oral   Take 1 tablet (10 mg total) by mouth daily.   30 tablet   0   . traMADol (ULTRAM) 50 MG tablet   Oral   Take 50 mg by mouth every 6 (six) hours as needed for pain.         Marland Kitchen zolpidem (AMBIEN) 5 MG tablet   Oral   Take 1 tablet (5 mg total) by mouth at bedtime as needed for sleep.   30 tablet   0   . sulfamethoxazole-trimethoprim (BACTRIM DS) 800-160 MG per tablet   Oral   Take 1 tablet by mouth 2 (two) times daily.   5 tablet   0   . traMADol (ULTRAM) 50 MG tablet   Oral   Take 1 tablet (50 mg total) by mouth every 6 (six) hours as needed for pain.   30 tablet   0     BP 144/82  Pulse 56  Temp(Src) 97.9 F (36.6 C)  Resp 14  SpO2 99%  Physical Exam  Vitals reviewed. Constitutional: She is oriented to person, place, and time. She appears well-developed and well-nourished.  HENT:  Head: Normocephalic.  Eyes: Pupils are equal, round, and reactive to light.  Neck: Normal range of motion.  Cardiovascular: Normal rate and regular rhythm.   Pulmonary/Chest: Effort normal.  Abdominal: Soft. Bowel sounds are normal. She exhibits no distension. There is no hepatosplenomegaly. There is tenderness in the right lower quadrant, suprapubic area and left lower quadrant. There is no rigidity, no rebound, no guarding and no CVA tenderness. No hernia.  Musculoskeletal: Normal range of motion.  Neurological: She is alert and oriented to person, place, and time.  Skin: Skin is warm and dry.    ED Course  Procedures (including critical care time)  Labs Reviewed  URINALYSIS, ROUTINE W REFLEX MICROSCOPIC - Abnormal; Notable for  the  following:    APPearance CLOUDY (*)    Leukocytes, UA SMALL (*)    All other components within normal limits  CBC WITH DIFFERENTIAL - Abnormal; Notable for the following:    WBC 11.2 (*)    Platelets 428 (*)    Eosinophils Relative 7 (*)    Eosinophils Absolute 0.8 (*)    All other components within normal limits  URINE MICROSCOPIC-ADD ON - Abnormal; Notable for the following:    Squamous Epithelial / LPF FEW (*)    Bacteria, UA FEW (*)    All other components within normal limits  POCT I-STAT, CHEM 8 - Abnormal; Notable for the following:    Glucose, Bld 110 (*)    All other components within normal limits  URINE CULTURE   No results found.   1. UTI (lower urinary tract infection)   2. Chronic abdominal pain       MDM   Will treat for UTI.  Have patient followup with her primary care physician, who will get her into see a urology specialist        Arman Filter, NP 10/25/12 [phone]

## 2012-10-26 LAB — URINE CULTURE

## 2012-10-27 ENCOUNTER — Telehealth (HOSPITAL_COMMUNITY): Payer: Self-pay | Admitting: Emergency Medicine

## 2012-10-27 NOTE — ED Provider Notes (Signed)
Medical screening examination/treatment/procedure(s) were conducted as a shared visit with non-physician practitioner(s) and myself.  I personally evaluated the patient during the encounter.  No acute abdomen.  Rx for UTI and follow up with primary care  Donnetta Hutching, MD 10/27/12 [phone]

## 2012-10-29 ENCOUNTER — Telehealth (HOSPITAL_COMMUNITY): Payer: Self-pay | Admitting: Emergency Medicine

## 2012-11-05 ENCOUNTER — Encounter: Payer: Self-pay | Admitting: Nurse Practitioner

## 2012-11-05 ENCOUNTER — Ambulatory Visit (INDEPENDENT_AMBULATORY_CARE_PROVIDER_SITE_OTHER): Payer: Medicare Other | Admitting: Nurse Practitioner

## 2012-11-05 VITALS — BP 124/72 | HR 54 | Temp 98.0°F | Resp 15 | Ht 64.5 in | Wt 189.2 lb

## 2012-11-05 DIAGNOSIS — K5900 Constipation, unspecified: Secondary | ICD-10-CM

## 2012-11-05 DIAGNOSIS — I10 Essential (primary) hypertension: Secondary | ICD-10-CM

## 2012-11-05 DIAGNOSIS — E785 Hyperlipidemia, unspecified: Secondary | ICD-10-CM

## 2012-11-05 DIAGNOSIS — G4700 Insomnia, unspecified: Secondary | ICD-10-CM

## 2012-11-05 DIAGNOSIS — I639 Cerebral infarction, unspecified: Secondary | ICD-10-CM | POA: Insufficient documentation

## 2012-11-05 DIAGNOSIS — I6350 Cerebral infarction due to unspecified occlusion or stenosis of unspecified cerebral artery: Secondary | ICD-10-CM

## 2012-11-05 DIAGNOSIS — IMO0001 Reserved for inherently not codable concepts without codable children: Secondary | ICD-10-CM

## 2012-11-05 DIAGNOSIS — F419 Anxiety disorder, unspecified: Secondary | ICD-10-CM

## 2012-11-05 DIAGNOSIS — F341 Dysthymic disorder: Secondary | ICD-10-CM

## 2012-11-05 DIAGNOSIS — D649 Anemia, unspecified: Secondary | ICD-10-CM

## 2012-11-05 DIAGNOSIS — N3020 Other chronic cystitis without hematuria: Secondary | ICD-10-CM

## 2012-11-05 DIAGNOSIS — F329 Major depressive disorder, single episode, unspecified: Secondary | ICD-10-CM

## 2012-11-05 DIAGNOSIS — M797 Fibromyalgia: Secondary | ICD-10-CM | POA: Insufficient documentation

## 2012-11-05 MED ORDER — ZOLPIDEM TARTRATE 5 MG PO TABS: 5.0000 mg | ORAL_TABLET | Freq: Every evening | ORAL | Status: DC | PRN

## 2012-11-05 MED ORDER — DULOXETINE HCL 30 MG PO CPEP: ORAL_CAPSULE | ORAL | Status: DC

## 2012-11-05 NOTE — Patient Instructions (Addendum)
Will start cymbalta 30 mg for 1 tablet for week then increase to 2 tablets daily  May use metamucil for bulk forming of stools Will get urology referral  To come back to office for lab work before 2 week EV appt with [facility]

## 2012-11-05 NOTE — Progress Notes (Signed)
Patient ID: [name], female   DOB: 06-Jan-1951, 62 y.o.   MRN: [id number]  Allergies  Allergen Reactions  . Ciprofloxacin Hcl Itching  . Food     Fish, Advertising account planner Complaint  Patient presents with  . NP to Establish    HPI: Patient is a 62 y.o. female seen in the office today to establish care- was previously going to [place] for primary care and that is too far away for her to travel.   Currently seeing Dr Susette Racer - cardiology   History of chronic cystitis- does not urology at this time. Previously seeing Dr Logan Bores but has not seen one since 1998 but would like to re-establish care with one.   Bad short term memory due to stroke she reports she has a very hard time remembering things (i.e. Forgot medications on counter for today's visit.)   Has not been on Wellbutrin and feels like her get up and go is gone. Not cheerful. Also has history of fibromyalga and bad neuropathy   plantar fasciitis has been an ongoing issue- got shoes that have helped but they are too heavy   Had a tummy tuck in 1985 and has had major scar tissue since-- would like a referral to gyn at [place]   Knots in right hip and down leg Review of Systems:  Review of Systems  Constitutional: Positive for weight loss (in last year has loss 60 lbs without trying to- decreaseed appetite ) and malaise/fatigue (history of fibromyalgia ).  HENT: Positive for hearing loss (left ear).   Eyes: Negative for blurred vision and pain.       Dry eyes   Respiratory: Positive for sputum production and wheezing (history of asthma with wheezing). Negative for cough and shortness of breath.   Cardiovascular: Positive for claudication and leg swelling (ankles mostly). Negative for chest pain and palpitations.  Gastrointestinal: Positive for diarrhea and constipation. Negative for heartburn, nausea, vomiting and abdominal pain.  Genitourinary: Positive for urgency and frequency.       Dull pain with urination for years due to  interstitial cystitis    Musculoskeletal: Positive for myalgias and falls (6 times last year; none in the last month).  Skin: Negative.   Neurological: Positive for tingling.  Endo/Heme/Allergies: Positive for environmental allergies. Bruises/bleeds easily.  Psychiatric/Behavioral: Positive for depression and memory loss (from stoke). Negative for suicidal ideas. The patient is nervous/anxious and has insomnia.      Past Medical History  Diagnosis Date  . CAD in native artery   . HTN (hypertension), benign   . Hyperlipidemia   . Depression   . Chronic back pain   . GERD (gastroesophageal reflux disease)   . Myocardial infarction   . Anginal pain   . Anxiety   . Asthma   . Pneumonia   . Peripheral vascular disease   . Neuromuscular disorder   . Headache   . Arthritis   . Fibromyalgia   . Anemia   . Constipation   . Headache   . Fatigue   . Allergy   . Stroke     2013   Past Surgical History  Procedure Laterality Date  . Hernia repair    . Abdominal hysterectomy    . Tummy tuck  1985  . Nose surgery  1985  . Cardiac catheterization    . Cosmetic surgery     Social History:   reports that she has never smoked. She has never used smokeless  tobacco. She reports that she does not drink alcohol or use illicit drugs.  Family History  Problem Relation Age of Onset  . CAD Mother   . Heart attack Mother   . CAD Brother   . Heart disease Brother   . Heart attack Father     Medications: Patient's Medications  New Prescriptions   No medications on file  Previous Medications   ALBUTEROL (PROVENTIL HFA;VENTOLIN HFA) 108 (90 BASE) MCG/ACT INHALER    Inhale 2 puffs into the lungs every 6 (six) hours as needed for wheezing.   AMLODIPINE (NORVASC) 5 MG TABLET    Take one tablet once daily for blood pressure   ASPIRIN EC 81 MG TABLET    Take 81 mg by mouth daily.   ATORVASTATIN (LIPITOR) 20 MG TABLET    Take 1 tablet (20 mg total) by mouth daily at 6 PM.   BUPROPION  (WELLBUTRIN SR) 150 MG 12 HR TABLET    Take 150 mg by mouth daily. Has not been on for 2 weeks due to no refills available   CARVEDILOL (COREG) 3.125 MG TABLET    Take 3.125 mg by mouth 2 (two) times daily with a meal.   GABAPENTIN (NEURONTIN) 600 MG TABLET    Take one tablet three times daily for neuropathy   GUAIFENESIN (MUCUS RELIEF ADULT PO)    Take by mouth. Take one tablet once daily as needed for congestion   HYDRALAZINE (APRESOLINE) 50 MG TABLET    Take one tablet three times daily to control BP   ISOSORBIDE MONONITRATE (IMDUR) 30 MG 24 HR TABLET    Take 1 tablet (30 mg total) by mouth daily.   NAPROXEN PO    Take one tablet once daily as needed for pain   NITROGLYCERIN (NITROSTAT) 0.4 MG SL TABLET    Place 0.4 mg under the tongue every 5 (five) minutes as needed for chest pain.   PENTOSAN POLYSULFATE (ELMIRON) 100 MG CAPSULE    Take 100 mg by mouth daily as needed (for pain).    PHENAZOPYRIDINE (PYRIDIUM) 100 MG TABLET    Take one tablet once daily as needed for UTI   PRASUGREL (EFFIENT) 10 MG TABS    Take 1 tablet (10 mg total) by mouth daily.   TRAMADOL (ULTRAM) 50 MG TABLET    Take 50 mg by mouth every 6 (six) hours as needed for pain.   TROLAMINE SALICYLATE (ASPERCREME EX)    Apply topically. Apply to feet nightly   VALACYCLOVIR (VALTREX) 500 MG TABLET    Take one tablet once daily as needed for fever blister   ZOLPIDEM (AMBIEN) 5 MG TABLET    Take 1 tablet (5 mg total) by mouth at bedtime as needed for sleep.  Modified Medications   No medications on file  Discontinued Medications   DEXTROMETHORPHAN 15 MG/5ML SYRUP    Take 10 mLs (30 mg total) by mouth 4 (four) times daily as needed for cough.   HYDRALAZINE (APRESOLINE) 25 MG TABLET    Take 1 tablet (25 mg total) by mouth every 8 (eight) hours.   MULTIPLE VITAMIN (MULTIVITAMIN WITH MINERALS) TABS    Take 1 tablet by mouth daily.   SULFAMETHOXAZOLE-TRIMETHOPRIM (BACTRIM DS) 800-160 MG PER TABLET    Take 1 tablet by mouth 2 (two)  times daily.   TRAMADOL (ULTRAM) 50 MG TABLET    Take 1 tablet (50 mg total) by mouth every 6 (six) hours as needed for pain.     Physical  Exam:  Filed Vitals:   11/05/12 0935  BP: 124/72  Pulse: 54  Temp: 98 F (36.7 C)  TempSrc: Oral  Resp: 15  Height: 5' 4.5" (1.638 m)  Weight: 189 lb 3.2 oz (85.821 kg)    Physical Exam  Constitutional: She is oriented to person, place, and time and well-developed, well-nourished, and in no distress. No distress.  Eyes: Conjunctivae and EOM are normal. Pupils are equal, round, and reactive to light. Right eye exhibits no discharge. Left eye exhibits no discharge. No scleral icterus.  Neck: Normal range of motion. Neck supple. No tracheal deviation present. No thyromegaly present.  Cardiovascular: Normal rate, regular rhythm and normal heart sounds.   Pulmonary/Chest: Effort normal and breath sounds normal. No respiratory distress. She has no wheezes.  Abdominal: Soft. Bowel sounds are normal. She exhibits no distension.  Musculoskeletal: Normal range of motion. She exhibits no edema and no tenderness.  Neurological: She is alert and oriented to person, place, and time.  Skin: Skin is warm and dry. She is not diaphoretic.  Psychiatric: Affect normal.     Labs reviewed: Basic Metabolic Panel:  Recent Labs  16/10/96 2340  09/10/12 1615  09/17/12 0620 09/17/12 0902 09/18/12 0455 10/25/12 0401  NA  --   < >  --   < > 136 137 141 142  K  --   < > 4.8  < > SPECIMEN CONTAMINATED, UNABLE TO PERFORM TEST(S). 3.7 3.8 3.7  CL  --   < >  --   < > 99 101 107 107  CO2  --   < >  --   < > 19 23 21   --   GLUCOSE  --   < >  --   < > 94 120* 93 110*  BUN  --   < >  --   < > 12 13 18 22   CREATININE  --   < >  --   < > 1.12* 1.22* 1.24* 0.90  CALCIUM  --   < >  --   < > SPECIMEN CONTAMINATED, UNABLE TO PERFORM TEST(S). 9.5 9.3  --   MG  --   --  2.1  --   --   --   --   --   TSH 4.758*  --   --   --   --   --   --   --   < > = values in this  interval not displayed. Liver Function Tests:  Recent Labs  09/09/12 1803  AST 27  ALT 23  ALKPHOS 129*  BILITOT 0.3  PROT 7.3  ALBUMIN 3.8   No results found for this basename: LIPASE, AMYLASE,  in the last 8760 hours No results found for this basename: AMMONIA,  in the last 8760 hours CBC:  Recent Labs  09/16/12 0537 09/17/12 0620 10/25/12 0352 10/25/12 0401  WBC 15.6* 11.4* 11.2*  --   NEUTROABS  --   --  7.3  --   HGB 13.8 13.0 12.2 12.9  HCT 39.6 35.5* 36.8 38.0  MCV 90.4 86.8 91.8  --   PLT 481* 503* 428*  --       Assessment/Plan  1.    Chronic cystitis 595.2   - will refer to urology at this time   2.   Anxiety and depression 300.4   - not having thoughts of hurting herself or others- with her history of fibromyalgia will start her on cymbalta 30 mg daily  for 1 week then to increase to twice daily. Will also consider starting lyrica to help with pain and mood in the future if cymbalta alone does not work   3.   HTN (hypertension) 401.9   - stable   4.   Stroke 434.91   -with increased memory loss- will check MMSE at next visit.    5.   Constipation 564.00    -may use metamucil or bene fiber daily to help bulk up stools to help with diarrhea and constipation    6.   Anemia 285.9   - follow up cbc before next visit    7.   Fibromyalgia 729.1   - see number 2   8.   Hyperlipidemia 272.4   Will check lipids before next visit    9.   Insomnia - will give Rx for ambein 5mg - encourage routine before bed and proper activity during the day    Labs/tests ordered

## 2012-11-12 ENCOUNTER — Encounter: Payer: Self-pay | Admitting: Nurse Practitioner

## 2012-11-16 ENCOUNTER — Other Ambulatory Visit: Payer: Medicare Other

## 2012-11-18 ENCOUNTER — Encounter: Payer: Medicare Other | Admitting: Nurse Practitioner

## 2012-11-19 ENCOUNTER — Telehealth: Payer: Self-pay | Admitting: Cardiology

## 2012-11-19 NOTE — Telephone Encounter (Signed)
Returned call.  Pt stated she is feeling better.  Pt stated it's the dizziness that is bothering her.  Stated she has been in the house for the past four days.  Pt c/o low HR and BP.  Asked pt what her BP was today and stated it was 120/82 w/ pulse 50.  Pt also c/o sweating a lot at night and that makes her nervous because of the sweating she had before her heart attack.  Stated she didn't have the sweats until she started the Norvasc.  Denied CP and SOB.  C/o BLE edema (int).  Stated it goes down when she puts her feet up.  Pt also c/o feeling tired a lot.  Pt advised to change positions slowly and to stay adequately hydrated.  Pt. Informed Carlynn Purl, PA-C will be notified tomorrow for further instructions and advised she keep her appt w/ Dr. Herbie Baltimore on Monday.  Pt verbalized understanding and agreed w/ plan.

## 2012-11-19 NOTE — Telephone Encounter (Signed)
She started on Norvasc 203 weeks ago-She have been dizzy a lot and pulse rate is lower 48 to 60-Blood pressure is so much better! Do does she need to do something about medicine? She also recert her for the next 60 days-she will send you that order.Also sweating a lot !

## 2012-11-20 NOTE — Telephone Encounter (Signed)
Returned call.  Pt informed per L. Kilroy, PA-C.  Pt unsure of dosage of pills she has currently and advised to continue current dose and discuss w/ Dr. Herbie Baltimore on Monday.  Pt advised to bring in all her meds to her appt.  Pt verbalized understanding and agreed w/ plan.

## 2012-11-20 NOTE — Telephone Encounter (Signed)
Call from [place]. Informed I spoke w/ pt as documented and will notify her and pt after a response.  Also reminded pt has appt on Monday w/ Dr. Herbie Baltimore.  Verbalized understanding.

## 2012-11-20 NOTE — Telephone Encounter (Signed)
Norvasc should not cause low HR. She can try decreasing her Coreg to 3.125 mg BID. Keep follow up with Dr Herbie Baltimore

## 2012-11-22 ENCOUNTER — Encounter: Payer: Self-pay | Admitting: Cardiology

## 2012-11-23 ENCOUNTER — Encounter: Payer: Self-pay | Admitting: Internal Medicine

## 2012-11-23 ENCOUNTER — Ambulatory Visit: Payer: Medicare Other | Admitting: Cardiology

## 2012-12-02 ENCOUNTER — Encounter: Payer: Medicare Other | Admitting: Nurse Practitioner

## 2012-12-11 ENCOUNTER — Telehealth: Payer: Self-pay | Admitting: Cardiology

## 2012-12-11 NOTE — Telephone Encounter (Signed)
She needs to be seen -- lets keep trying to work with her. Marykay Lex, MD

## 2012-12-11 NOTE — Telephone Encounter (Signed)
Pt refused her visit today-said she had an emergency with her son-wanted you to know this is the third time she refused a visit-she refused on 5-22,5-26 and today,6-6!Please call if you need to-just wanted the doctor to be aware of this!

## 2012-12-11 NOTE — Telephone Encounter (Signed)
Message forwarded to Dr. Harding (FYI). 

## 2012-12-11 NOTE — Telephone Encounter (Signed)
Message forwarded to S. Martin, RN.  

## 2012-12-24 ENCOUNTER — Telehealth: Payer: Self-pay | Admitting: Geriatric Medicine

## 2012-12-24 NOTE — Telephone Encounter (Signed)
Joyce Gross from [facility] just called and stated that this patient will be discharged from home health due to multiple attempts to reach patient. She will not answer the door, or the phone.

## 2013-01-05 DIAGNOSIS — Z9289 Personal history of other medical treatment: Secondary | ICD-10-CM

## 2013-01-05 HISTORY — DX: Personal history of other medical treatment: Z92.89

## 2013-01-30 ENCOUNTER — Encounter (HOSPITAL_COMMUNITY): Payer: Self-pay | Admitting: Emergency Medicine

## 2013-01-30 ENCOUNTER — Inpatient Hospital Stay (HOSPITAL_COMMUNITY)
Admission: EM | Admit: 2013-01-30 | Discharge: 2013-02-01 | DRG: 313 | Disposition: A | Discharge status: Home / Self Care | Payer: Medicare Other | Attending: Cardiology | Admitting: Cardiology

## 2013-01-30 DIAGNOSIS — N3020 Other chronic cystitis without hematuria: Secondary | ICD-10-CM

## 2013-01-30 DIAGNOSIS — R079 Chest pain, unspecified: Principal | ICD-10-CM | POA: Diagnosis present

## 2013-01-30 DIAGNOSIS — I2510 Atherosclerotic heart disease of native coronary artery without angina pectoris: Secondary | ICD-10-CM | POA: Diagnosis present

## 2013-01-30 DIAGNOSIS — K219 Gastro-esophageal reflux disease without esophagitis: Secondary | ICD-10-CM | POA: Diagnosis present

## 2013-01-30 DIAGNOSIS — I498 Other specified cardiac arrhythmias: Secondary | ICD-10-CM | POA: Diagnosis present

## 2013-01-30 DIAGNOSIS — R001 Bradycardia, unspecified: Secondary | ICD-10-CM

## 2013-01-30 DIAGNOSIS — I10 Essential (primary) hypertension: Secondary | ICD-10-CM | POA: Diagnosis present

## 2013-01-30 DIAGNOSIS — E785 Hyperlipidemia, unspecified: Secondary | ICD-10-CM | POA: Diagnosis present

## 2013-01-30 DIAGNOSIS — F411 Generalized anxiety disorder: Secondary | ICD-10-CM | POA: Diagnosis present

## 2013-01-30 DIAGNOSIS — Z7982 Long term (current) use of aspirin: Secondary | ICD-10-CM

## 2013-01-30 DIAGNOSIS — I252 Old myocardial infarction: Secondary | ICD-10-CM

## 2013-01-30 DIAGNOSIS — J45909 Unspecified asthma, uncomplicated: Secondary | ICD-10-CM | POA: Diagnosis present

## 2013-01-30 DIAGNOSIS — Z7902 Long term (current) use of antithrombotics/antiplatelets: Secondary | ICD-10-CM

## 2013-01-30 DIAGNOSIS — Z9861 Coronary angioplasty status: Secondary | ICD-10-CM

## 2013-01-30 DIAGNOSIS — I739 Peripheral vascular disease, unspecified: Secondary | ICD-10-CM | POA: Diagnosis present

## 2013-01-30 DIAGNOSIS — F329 Major depressive disorder, single episode, unspecified: Secondary | ICD-10-CM | POA: Diagnosis present

## 2013-01-30 DIAGNOSIS — F3289 Other specified depressive episodes: Secondary | ICD-10-CM | POA: Diagnosis present

## 2013-01-30 DIAGNOSIS — Z8673 Personal history of transient ischemic attack (TIA), and cerebral infarction without residual deficits: Secondary | ICD-10-CM

## 2013-01-30 DIAGNOSIS — R112 Nausea with vomiting, unspecified: Secondary | ICD-10-CM | POA: Diagnosis present

## 2013-01-30 DIAGNOSIS — Z79899 Other long term (current) drug therapy: Secondary | ICD-10-CM

## 2013-01-30 DIAGNOSIS — I200 Unstable angina: Secondary | ICD-10-CM

## 2013-01-30 DIAGNOSIS — I214 Non-ST elevation (NSTEMI) myocardial infarction: Secondary | ICD-10-CM

## 2013-01-30 DIAGNOSIS — N390 Urinary tract infection, site not specified: Secondary | ICD-10-CM | POA: Diagnosis present

## 2013-01-30 DIAGNOSIS — Z8249 Family history of ischemic heart disease and other diseases of the circulatory system: Secondary | ICD-10-CM

## 2013-01-30 DIAGNOSIS — M549 Dorsalgia, unspecified: Secondary | ICD-10-CM | POA: Diagnosis present

## 2013-01-30 DIAGNOSIS — Z87440 Personal history of urinary (tract) infections: Secondary | ICD-10-CM

## 2013-01-30 DIAGNOSIS — M797 Fibromyalgia: Secondary | ICD-10-CM

## 2013-01-30 DIAGNOSIS — G894 Chronic pain syndrome: Secondary | ICD-10-CM | POA: Diagnosis present

## 2013-01-30 MED ORDER — NITROGLYCERIN 0.4 MG SL SUBL
0.4000 mg | SUBLINGUAL_TABLET | SUBLINGUAL | Status: DC | PRN
  Administered 2013-01-31: 0.4 mg via SUBLINGUAL
  Filled 2013-01-30: qty 25

## 2013-01-30 NOTE — ED Provider Notes (Signed)
CSN: [id number]     Arrival date & time 01/30/13  2329 History     First MD Initiated Contact with Patient 01/30/13 2348     Chief Complaint  Patient presents with  . Chest Pain   HPI CATHARINA PICA is a 62 y.o. female with accommodative medical history including coronary artery disease and had stents placed originally in 2005, this was reoccluded in March of 2014 and was replaced.  Patient presents for multiple complaints today including chest pain. 3 days ago, she started having lower abdominal pain, he says this was consistent with her prior cystitis, she saw an urgent care provider and was put on Bactrim for UTI.   This morning, she took 2 nitroglycerin and felt better, less chest pain, then she proceeded to have nausea and vomiting all day, with 2 episodes of watery diarrhea. She says her chest pain is "like someone is pounding it with a sledgehammer", it is substernal, does not radiate, it did not get better with one nitroglycerin per EMS, stenosis. Shortness of breath, occasionally she feels hot, and she had the pain in her chest before vomiting. Patient says this feels like her prior MI in 2005. She says she's not sure of her medications, she's not able to keep them straight he doesn't know what she is taking. She says she did have home health nurse ranging her medications but this stopped about 3 weeks ago. Primary care physician is now Dr. Clarene Duke.   Past Medical History  Diagnosis Date  . CAD in native artery 2005    Cyphe DES to LAD => 09/2012; stent totally occluded --> PCI with Xience Expedition 2.75 mm x 33 mm, post-dilated to 3.1 mm (smal Diag was jailed)  . Myocardial infarction of anterolateral wall   . Hyperlipidemia   . Hypertension   . Chronic back pain   . GERD (gastroesophageal reflux disease)   . Depression   . Stroke 2013  . Anxiety   . Asthma   . Pneumonia     history of   . Peripheral vascular disease   . Neuromuscular disorder   . Headache(784.0)   . Arthritis    . Fibromyalgia   . Anemia   . Constipation   . Headache(784.0)   . Fatigue   . Allergy   . CAD in native artery '05- present    2D Echo - EF 55-65%, left atrium moderately dilated; Myocardial SPECT 12/08/2008 - EF 64%, no sign of ischemia or infarct   Past Surgical History  Procedure Laterality Date  . Hernia repair    . Abdominal hysterectomy    . Tummy tuck  1985  . Nose surgery  1985  . Cardiac catheterization Left 09/09/2012    Predilation balloon 2.5x15 Sprinter Legend, scroing balloon AngioSculpt 2.5x15, Xience Expidention 2.75x33 from proximal LAD across D2, post dilation [place] 3x20  . Cosmetic surgery    . Cardiac catheterization Left 05/21/2010    LAD proximal first diagonal branch 70% ostial lesion  . Cardiac catheterization Left 06/13/2005    Obstructive disease of the first diagonal at stent site of LAD, continue medical therapy  . Cardiac catheterization  11/22/2003    70-80% ostial stenosis in proximal LAD beyond stented segment  . Cardiac catheterization  08/23/2003    Medical therapy  . Cardiac catheterization  08/16/2003    2.75x10 cutter for PCI, 3x13 Cypher drug-eluting stent deployed ar 13 atomspheres resulting in a reduction of a proximal 70-80% segmental stenosis to 0%  residul   Family History  Problem Relation Age of Onset  . CAD Mother   . Heart attack Mother   . CAD Brother   . Heart disease Brother   . Heart attack Father    History  Substance Use Topics  . Smoking status: Never Smoker   . Smokeless tobacco: Never Used  . Alcohol Use: No   OB History   Grav Para Term Preterm Abortions TAB SAB Ect Mult Living                 Review of Systems At least 10pt or greater review of systems completed and are negative except where specified in the HPI.  Allergies  Ciprofloxacin hcl and Food  Home Medications   Current Outpatient Rx  Name  Route  Sig  Dispense  Refill  . albuterol (PROVENTIL HFA;VENTOLIN HFA) 108 (90 BASE) MCG/ACT inhaler    Inhalation   Inhale 2 puffs into the lungs every 6 (six) hours as needed for wheezing.   1 Inhaler   2   . amLODipine (NORVASC) 5 MG tablet      Take one tablet once daily for blood pressure         . aspirin EC 81 MG tablet   Oral   Take 81 mg by mouth daily.         Marland Kitchen atorvastatin (LIPITOR) 20 MG tablet   Oral   Take 1 tablet (20 mg total) by mouth daily at 6 PM.   30 tablet   0   . carvedilol (COREG) 3.125 MG tablet   Oral   Take 3.125 mg by mouth 2 (two) times daily with a meal.         . DULoxetine (CYMBALTA) 30 MG capsule      Take 1 tablet daily for 1 week then increase to 2 tablets daily   60 capsule   0   . gabapentin (NEURONTIN) 600 MG tablet      Take one tablet three times daily for neuropathy         . GuaiFENesin (MUCUS RELIEF ADULT PO)   Oral   Take by mouth. Take one tablet once daily as needed for congestion         . hydrALAZINE (APRESOLINE) 50 MG tablet      Take one tablet three times daily to control BP         . isosorbide mononitrate (IMDUR) 30 MG 24 hr tablet   Oral   Take 1 tablet (30 mg total) by mouth daily.   30 tablet   0   . NAPROXEN PO      Take one tablet once daily as needed for pain         . nitroGLYCERIN (NITROSTAT) 0.4 MG SL tablet   Sublingual   Place 0.4 mg under the tongue every 5 (five) minutes as needed for chest pain.         . pentosan polysulfate (ELMIRON) 100 MG capsule   Oral   Take 100 mg by mouth daily as needed (for pain).          . phenazopyridine (PYRIDIUM) 100 MG tablet      Take one tablet once daily as needed for UTI         . prasugrel (EFFIENT) 10 MG TABS   Oral   Take 1 tablet (10 mg total) by mouth daily.   30 tablet   0   . traMADol (ULTRAM) 50  MG tablet   Oral   Take 50 mg by mouth every 6 (six) hours as needed for pain.         Terrance Mass Salicylate (ASPERCREME EX)   Apply externally   Apply topically. Apply to feet nightly         . valACYclovir  (VALTREX) 500 MG tablet      Take one tablet once daily as needed for fever blister         . zolpidem (AMBIEN) 5 MG tablet   Oral   Take 1 tablet (5 mg total) by mouth at bedtime as needed for sleep.   30 tablet   0    BP 145/64  Pulse 44  Temp(Src) 98.1 F (36.7 C) (Oral)  Resp 16  SpO2 97% Physical Exam  Nursing notes reviewed.  Electronic medical record reviewed. VITAL SIGNS:   Filed Vitals:   01/31/13 0200 01/31/13 0230 01/31/13 0335 01/31/13 0640  BP: 174/74  180/85 158/78  Pulse: 55 48 48 54  Temp:   98.1 F (36.7 C)   TempSrc:   Oral   Resp: 17 12 18    Height:   5\' 4"  (1.626 m)   Weight:   190 lb 7.6 oz (86.4 kg)   SpO2: 97% 99% 96%    CONSTITUTIONAL: Awake, oriented, appears non-toxic but slightly anxious HENT: Atraumatic, normocephalic, oral mucosa pink and moist, airway patent. Nares patent without drainage. External ears normal. EYES: Conjunctiva clear, EOMI, PERRLA NECK: Trachea midline, non-tender, supple CARDIOVASCULAR: Normal heart rate, Normal rhythm, No murmurs, rubs, gallops PULMONARY/CHEST: Clear to auscultation, no rhonchi, wheezes, or rales. Symmetrical breath sounds. Non-tender. ABDOMINAL: Non-distended, soft, non-tender - no rebound or guarding.  BS normal. NEUROLOGIC: Non-focal, moving all four extremities, no gross sensory or motor deficits. EXTREMITIES: No clubbing, cyanosis, or edema SKIN: Warm, Dry, No erythema, No rash  ED Course   Procedures (including critical care time)  Date: 01/31/2013  Rate: 43  Rhythm: normal sinus rhythm  QRS Axis: normal  Intervals: normal  ST/T Wave abnormalities: normal  Conduction Disutrbances: none  Narrative Interpretation: Sinus bradycardia no ST or T wave abnormalities to suggest acute ischemia or infarction   Labs Reviewed  CBC - Abnormal; Notable for the following:    WBC 11.6 (*)    Platelets 408 (*)    All other components within normal limits  BASIC METABOLIC PANEL - Abnormal; Notable  for the following:    Glucose, Bld 118 (*)    GFR calc non Af Amer 60 (*)    GFR calc Af Amer 70 (*)    All other components within normal limits  HEPATIC FUNCTION PANEL - Abnormal; Notable for the following:    Alkaline Phosphatase 151 (*)    All other components within normal limits  CBC - Abnormal; Notable for the following:    WBC 14.0 (*)    All other components within normal limits  BASIC METABOLIC PANEL - Abnormal; Notable for the following:    Glucose, Bld 117 (*)    GFR calc non Af Amer 62 (*)    GFR calc Af Amer 72 (*)    All other components within normal limits  LIPID PANEL  T4, FREE  HEMOGLOBIN A1C  TSH  POCT I-STAT TROPONIN I   Dg Chest 2 View  01/31/2013   *RADIOLOGY REPORT*  Clinical Data: The lower abdominal and vaginal pain starting 3 days ago.  Now with chest pain.  The sweats and chills.  CHEST -  2 VIEW  Comparison: 09/14/2012  Findings: Slightly shallow inspiration.  Heart size and pulmonary vascularity are normal for technique. Interval resolution of infiltrative changes in the right lung.  No focal airspace consolidation.  No blunting of costophrenic angles.  No pneumothorax.  Tortuous aorta.  Degenerative changes in the spine and shoulders.  IMPRESSION: No evidence of active pulmonary disease.   Original Report Authenticated By: Burman Nieves, M.D.   1. Chest pain   2. Bradycardia     MDM  Patient arrives with chest pain that she says feels like her prior MI. This did get better with nitroglycerin at home. Patient is not sure what medications she is taking, she is supposed to be on a long-acting nitrate, is uncertain whether she is taking this. Patient is also not sure, to Lopressor she is taking daily, she may have doubled up on her doses-she says this is a possibility. Patient presents bradycardic, but has no symptoms secondary to this, she has no dizziness or lightheadedness. She's got no hypotension.  Patient is high risk chest pain, we'll admit the  patient, discussed with Dr.Aitsebaomo for admission. She is admitted in stable condition.   Jones Skene, MD 01/31/13 2956

## 2013-01-30 NOTE — ED Notes (Signed)
Per EMS: Pt reports that 3 days ago she started having lower abdominal/vaginal pain. Since the pain has moved to the mid abdomen and now in the chest. Pt reports feeling, and appears very anxious. Pt Ax4, NAD. BP initially elevated.

## 2013-01-30 NOTE — ED Notes (Signed)
EMS reports administering 325 mg of Aspirin in route to hospital. States pain was unrelieved by NTG.

## 2013-01-31 ENCOUNTER — Emergency Department (HOSPITAL_COMMUNITY): Payer: Medicare Other

## 2013-01-31 ENCOUNTER — Encounter (HOSPITAL_COMMUNITY): Payer: Self-pay | Admitting: *Deleted

## 2013-01-31 DIAGNOSIS — R079 Chest pain, unspecified: Secondary | ICD-10-CM

## 2013-01-31 DIAGNOSIS — I498 Other specified cardiac arrhythmias: Secondary | ICD-10-CM

## 2013-01-31 DIAGNOSIS — I200 Unstable angina: Secondary | ICD-10-CM

## 2013-01-31 LAB — CBC
HCT: 36.5 % (ref 36.0–46.0)
HCT: 37.1 % (ref 36.0–46.0)
Hemoglobin: 12 g/dL (ref 12.0–15.0)
MCH: 28.6 pg (ref 26.0–34.0)
MCHC: 32.3 g/dL (ref 30.0–36.0)
MCHC: 32.9 g/dL (ref 30.0–36.0)
MCV: 88.3 fL (ref 78.0–100.0)
MCV: 88.4 fL (ref 78.0–100.0)
RDW: 13.7 % (ref 11.5–15.5)

## 2013-01-31 LAB — BASIC METABOLIC PANEL
BUN: 13 mg/dL (ref 6–23)
BUN: 14 mg/dL (ref 6–23)
Calcium: 9.3 mg/dL (ref 8.4–10.5)
Calcium: 9.4 mg/dL (ref 8.4–10.5)
Creatinine, Ser: 0.97 mg/dL (ref 0.50–1.10)
Creatinine, Ser: 0.99 mg/dL (ref 0.50–1.10)
GFR calc Af Amer: 70 mL/min — ABNORMAL LOW (ref >90)
GFR calc non Af Amer: 60 mL/min — ABNORMAL LOW (ref >90)
GFR calc non Af Amer: 62 mL/min — ABNORMAL LOW (ref >90)
Glucose, Bld: 117 mg/dL — ABNORMAL HIGH (ref 70–99)

## 2013-01-31 LAB — LIPID PANEL
HDL: 53 mg/dL (ref >39)
LDL Cholesterol: 82 mg/dL (ref 0–99)
Total CHOL/HDL Ratio: 3 RATIO
Triglycerides: 132 mg/dL (ref <150)

## 2013-01-31 LAB — HEPATIC FUNCTION PANEL
Alkaline Phosphatase: 151 U/L — ABNORMAL HIGH (ref 39–117)
Total Bilirubin: 0.3 mg/dL (ref 0.3–1.2)

## 2013-01-31 LAB — TSH: TSH: 4.671 u[IU]/mL — ABNORMAL HIGH (ref 0.350–4.500)

## 2013-01-31 MED ORDER — ASPIRIN EC 81 MG PO TBEC: 81.0000 mg | DELAYED_RELEASE_TABLET | Freq: Every day | ORAL | Status: DC

## 2013-01-31 MED ORDER — ALBUTEROL SULFATE HFA 108 (90 BASE) MCG/ACT IN AERS: 2.0000 | INHALATION_SPRAY | Freq: Four times a day (QID) | RESPIRATORY_TRACT | Status: DC | PRN

## 2013-01-31 MED ORDER — OXYCODONE-ACETAMINOPHEN 5-325 MG PO TABS
1.0000 | ORAL_TABLET | Freq: Four times a day (QID) | ORAL | Status: DC | PRN
  Administered 2013-01-31 (×2): 1 via ORAL
  Filled 2013-01-31 (×2): qty 1

## 2013-01-31 MED ORDER — ATORVASTATIN CALCIUM 80 MG PO TABS
80.0000 mg | ORAL_TABLET | Freq: Every day | ORAL | Status: DC
  Administered 2013-01-31: 80 mg via ORAL
  Filled 2013-01-31 (×2): qty 1

## 2013-01-31 MED ORDER — ISOSORBIDE MONONITRATE ER 60 MG PO TB24
120.0000 mg | ORAL_TABLET | Freq: Every day | ORAL | Status: DC
  Administered 2013-01-31 – 2013-02-01 (×2): 120 mg via ORAL
  Filled 2013-01-31 (×2): qty 2

## 2013-01-31 MED ORDER — TRAMADOL HCL 50 MG PO TABS
50.0000 mg | ORAL_TABLET | Freq: Four times a day (QID) | ORAL | Status: DC | PRN
  Administered 2013-01-31 – 2013-02-01 (×2): 100 mg via ORAL
  Filled 2013-01-31 (×2): qty 2

## 2013-01-31 MED ORDER — NITROGLYCERIN 0.4 MG SL SUBL: 0.4000 mg | SUBLINGUAL_TABLET | SUBLINGUAL | Status: DC | PRN

## 2013-01-31 MED ORDER — REGADENOSON 0.4 MG/5ML IV SOLN
0.4000 mg | Freq: Once | INTRAVENOUS | Status: AC
  Administered 2013-02-01: 0.4 mg via INTRAVENOUS
  Filled 2013-01-31: qty 5

## 2013-01-31 MED ORDER — OXYCODONE-ACETAMINOPHEN 5-325 MG PO TABS
1.0000 | ORAL_TABLET | ORAL | Status: DC | PRN
  Administered 2013-01-31 – 2013-02-01 (×3): 1 via ORAL
  Filled 2013-01-31 (×5): qty 1

## 2013-01-31 MED ORDER — ENOXAPARIN SODIUM 40 MG/0.4ML ~~LOC~~ SOLN
40.0000 mg | SUBCUTANEOUS | Status: DC
  Administered 2013-01-31 – 2013-02-01 (×2): 40 mg via SUBCUTANEOUS
  Filled 2013-01-31 (×3): qty 0.4

## 2013-01-31 MED ORDER — ONDANSETRON HCL 4 MG/2ML IJ SOLN
4.0000 mg | Freq: Four times a day (QID) | INTRAMUSCULAR | Status: DC | PRN
  Administered 2013-01-31: 4 mg via INTRAVENOUS
  Filled 2013-01-31: qty 2

## 2013-01-31 MED ORDER — PANTOPRAZOLE SODIUM 20 MG PO TBEC
20.0000 mg | DELAYED_RELEASE_TABLET | Freq: Every day | ORAL | Status: DC
  Administered 2013-01-31 – 2013-02-01 (×2): 20 mg via ORAL
  Filled 2013-01-31 (×2): qty 1

## 2013-01-31 MED ORDER — LOSARTAN POTASSIUM 50 MG PO TABS
100.0000 mg | ORAL_TABLET | Freq: Two times a day (BID) | ORAL | Status: DC
  Administered 2013-01-31 – 2013-02-01 (×3): 100 mg via ORAL
  Filled 2013-01-31 (×4): qty 2

## 2013-01-31 MED ORDER — SULFAMETHOXAZOLE-TRIMETHOPRIM 800-160 MG PO TABS
1.0000 | ORAL_TABLET | Freq: Two times a day (BID) | ORAL | Status: DC
  Administered 2013-01-31 – 2013-02-01 (×3): 1 via ORAL
  Filled 2013-01-31 (×4): qty 1

## 2013-01-31 MED ORDER — SIMETHICONE 80 MG PO CHEW
80.0000 mg | CHEWABLE_TABLET | Freq: Once | ORAL | Status: AC
  Administered 2013-01-31: 80 mg via ORAL
  Filled 2013-01-31: qty 1

## 2013-01-31 MED ORDER — ASPIRIN EC 81 MG PO TBEC
81.0000 mg | DELAYED_RELEASE_TABLET | Freq: Every day | ORAL | Status: DC
  Administered 2013-01-31 – 2013-02-01 (×2): 81 mg via ORAL
  Filled 2013-01-31 (×2): qty 1

## 2013-01-31 MED ORDER — GI COCKTAIL ~~LOC~~
30.0000 mL | Freq: Once | ORAL | Status: AC
  Administered 2013-01-31: 30 mL via ORAL
  Filled 2013-01-31: qty 30

## 2013-01-31 MED ORDER — ACETAMINOPHEN 325 MG PO TABS: 650.0000 mg | ORAL_TABLET | ORAL | Status: DC | PRN

## 2013-01-31 MED ORDER — ONDANSETRON HCL 4 MG/2ML IJ SOLN
4.0000 mg | Freq: Once | INTRAMUSCULAR | Status: AC
  Administered 2013-01-31: 4 mg via INTRAVENOUS
  Filled 2013-01-31: qty 2

## 2013-01-31 MED ORDER — PRASUGREL HCL 10 MG PO TABS
10.0000 mg | ORAL_TABLET | Freq: Every day | ORAL | Status: DC
  Administered 2013-01-31 – 2013-02-01 (×2): 10 mg via ORAL
  Filled 2013-01-31 (×2): qty 1

## 2013-01-31 NOTE — Progress Notes (Signed)
[facility]  Subjective: Still has intermitted chest discomfort, but much improved since last night.   Objective: Vital signs in last 24 hours: Temp:  [98.1 F (36.7 C)] 98.1 F (36.7 C) (07/27 0335) Pulse Rate:  [43-55] 54 (07/27 0640) Resp:  [12-19] 18 (07/27 0335) BP: (143-180)/(64-85) 158/78 mmHg (07/27 0640) SpO2:  [96 %-100 %] 96 % (07/27 0335) Weight:  [190 lb 7.6 oz (86.4 kg)] 190 lb 7.6 oz (86.4 kg) (07/27 0335) Last BM Date: 01/31/13  Intake/Output from previous day:   Intake/Output this shift:    Medications Current Facility-Administered Medications  Medication Dose Route Frequency Provider Last Rate Last Dose  . acetaminophen (TYLENOL) tablet 650 mg  650 mg Oral Q4H PRN John-Adam Bonk, MD      . albuterol (PROVENTIL HFA;VENTOLIN HFA) 108 (90 BASE) MCG/ACT inhaler 2 puff  2 puff Inhalation Q6H PRN John-Adam Bonk, MD      . aspirin EC tablet 81 mg  81 mg Oral Daily John-Adam Bonk, MD   81 mg at 01/31/13 0958  . atorvastatin (LIPITOR) tablet 80 mg  80 mg Oral q1800 John-Adam Bonk, MD      . enoxaparin (LOVENOX) injection 40 mg  40 mg Subcutaneous Q24H John-Adam Bonk, MD   40 mg at 01/31/13 0233  . isosorbide mononitrate (IMDUR) 24 hr tablet 120 mg  120 mg Oral Daily John-Adam Bonk, MD   120 mg at 01/31/13 0958  . losartan (COZAAR) tablet 100 mg  100 mg Oral BID John-Adam Bonk, MD   100 mg at 01/31/13 0958  . nitroGLYCERIN (NITROSTAT) SL tablet 0.4 mg  0.4 mg Sublingual Q5 min PRN John-Adam Bonk, MD   0.4 mg at 01/31/13 0008  . nitroGLYCERIN (NITROSTAT) SL tablet 0.4 mg  0.4 mg Sublingual Q5 min PRN John-Adam Bonk, MD      . ondansetron (ZOFRAN) injection 4 mg  4 mg Intravenous Q6H PRN John-Adam Bonk, MD      . oxyCODONE-acetaminophen (PERCOCET/ROXICET) 5-325 MG per tablet 1 tablet  1 tablet Oral Q6H PRN Jones Skene, MD   1 tablet at 01/31/13 0956  . prasugrel (EFFIENT) tablet 10 mg  10 mg Oral Daily John-Adam Bonk, MD   10 mg at 01/31/13  0957  . sulfamethoxazole-trimethoprim (BACTRIM DS,SEPTRA DS) 800-160 MG per tablet 1 tablet  1 tablet Oral BID Jones Skene, MD   1 tablet at 01/31/13 0957    PE: General appearance: alert, cooperative and no distress Lungs: clear to auscultation bilaterally Heart: regular rate and rhythm Extremities: no LEE Pulses: 2+ and symmetric Skin: warm and dry Neurologic: Grossly normal  Lab Results:   Recent Labs  01/30/13 2357 01/31/13 0555  WBC 11.6* 14.0*  HGB 12.0 12.0  HCT 36.5 37.1  PLT 408* 385   BMET  Recent Labs  01/30/13 2357 01/31/13 0555  NA 136 136  K 4.0 4.1  CL 101 101  CO2 27 24  GLUCOSE 118* 117*  BUN 14 13  CREATININE 0.99 0.97  CALCIUM 9.4 9.3  Cholesterol  Recent Labs  01/31/13 0555  CHOL 161   Cardiac Panel (last 3 results)  Recent Labs  01/31/13 1021  TROPONINI <0.30    Assessment/Plan  Active Problems:   CAD, LAD DES '05, four caths since have shown patent LAD, last cath 3/14 - Totally occluded --> PCI with Xience Expedition 2.75 mm x 33 mm, post-dil 3.1 mm    HTN (hypertension)   Hyperlipidemia   Unstable angina  Plan:  The patient is a 62 y/o female, admitted by the Cardiology Fellow earlier this am for evaluation of chest pain that is concerning for unstable angina. She has known CAD, s/p PCI + stenting of LAD in 08/2012 for NSTEMI. Her EKG shows sinus bradycardia with ventricular rate of 43, but no acute changes. Her BB is on hold. She states that she may have been taking both Lopressor and Coreg together by mistake. HR has improved to the upper 50s. Continue on ASA, Effient, Imdur, Cozaar and Lipitor. May resume BB once HR improves. Chart review reveals that she should be on Coreg and not lopressor. Will resume when appropriate. I agree with cycling cardiac enzyme for MI rule out. Initial troponin is negative. Will continue to trend. If negative, will plan for NST in the AM.     LOS: 1 day    Talley Casco M. Delmer Islam 01/31/2013 12:51 PM

## 2013-01-31 NOTE — Progress Notes (Signed)
H&P and admission was completed by Dr. Katha Cabal earlier this morning. I have reviewed the patient's chart. Plan by MD was to admit to rule out MI. Serial cardiac enzymes were not ordered. I will place an order for Troponin now + Q6h x 3. Will follow trend. Dr. Rennis Golden to follow.   Allayne Butcher, PA-C [facility]

## 2013-01-31 NOTE — H&P (Signed)
[name] is an 62 y.o. female.    Chief Complaint: Chest pain  HPI: 62 y/o female with a PMH of CAD presenting for chest pain evaluation.  She has a prior history of LAD stent in 2005.  She was previously admitted 09/2012 for NSTEMI resulting in DES to LAD.  Patient has been doing fine after discharge until the day prior to presentation when she woke up with chest pain.  Her chest pain is described as a tightness, 8/10 in severity and is associated with nausea, diaphoresis and palpitation.  She denies dizziness, syncope or orthopnea.  She states that this chest pain is similar to the chest pain that led to LAD stenting.  She received SL nitroglycerin and she is currently chest pain free. Her EKG did not show ST- or T-wave changes to suggest ischemia.  First set of cardiac marker is negative. She is currently clinically and hemodynamically stable.  She is concerned about her bradycardia (heart rate in the 40's), but she denies dizziness or syncope.  It appears that she may be taking Carvedilol and Metoprolol at the same time.       Past Medical History  Diagnosis Date  . CAD in native artery 2005    Cyphe DES to LAD => 09/2012; stent totally occluded --> PCI with Xience Expedition 2.75 mm x 33 mm, post-dilated to 3.1 mm (smal Diag was jailed)  . Myocardial infarction of anterolateral wall   . Hyperlipidemia   . Hypertension   . Chronic back pain   . GERD (gastroesophageal reflux disease)   . Depression   . Stroke 2013  . Anxiety   . Asthma   . Pneumonia     history of   . Peripheral vascular disease   . Neuromuscular disorder   . Headache(784.0)   . Arthritis   . Fibromyalgia   . Anemia   . Constipation   . Headache(784.0)   . Fatigue   . Allergy   . CAD in native artery '05- present    2D Echo - EF 55-65%, left atrium moderately dilated; Myocardial SPECT 12/08/2008 - EF 64%, no sign of ischemia or infarct    Past Surgical History  Procedure Laterality Date  . Hernia repair    .  Abdominal hysterectomy    . Tummy tuck  1985  . Nose surgery  1985  . Cardiac catheterization Left 09/09/2012    Predilation balloon 2.5x15 [place] Legend, scroing balloon AngioSculpt 2.5x15, Xience Expidention 2.75x33 from proximal LAD across D2, post dilation [place] 3x20  . Cosmetic surgery    . Cardiac catheterization Left 05/21/2010    LAD proximal first diagonal branch 70% ostial lesion  . Cardiac catheterization Left 06/13/2005    Obstructive disease of the first diagonal at stent site of LAD, continue medical therapy  . Cardiac catheterization  11/22/2003    70-80% ostial stenosis in proximal LAD beyond stented segment  . Cardiac catheterization  08/23/2003    Medical therapy  . Cardiac catheterization  08/16/2003    2.75x10 cutter for PCI, 3x13 Cypher drug-eluting stent deployed ar 13 atomspheres resulting in a reduction of a proximal 70-80% segmental stenosis to 0% residul    Family History  Problem Relation Age of Onset  . CAD Mother   . Heart attack Mother   . CAD Brother   . Heart disease Brother   . Heart attack Father    Social History:  reports that she has never smoked. She has never used  smokeless tobacco. She reports that she does not drink alcohol or use illicit drugs.  Allergies:  Allergies  Allergen Reactions  . Ciprofloxacin Hcl Itching  . Food Other (See Comments)    Fish, corn (per allergy testing)     (Not in a hospital admission)  Results for orders placed during the hospital encounter of 01/30/13 (from the past 48 hour(s))  CBC     Status: Abnormal   Collection Time    01/30/13 11:57 PM      Result Value Range   WBC 11.6 (*) 4.0 - 10.5 K/uL   RBC 4.13  3.87 - 5.11 MIL/uL   Hemoglobin 12.0  12.0 - 15.0 g/dL   HCT 16.1  09.6 - 04.5 %   MCV 88.4  78.0 - 100.0 fL   MCH 29.1  26.0 - 34.0 pg   MCHC 32.9  30.0 - 36.0 g/dL   RDW 40.9  81.1 - 91.4 %   Platelets 408 (*) 150 - 400 K/uL  BASIC METABOLIC PANEL     Status: Abnormal   Collection Time     01/30/13 11:57 PM      Result Value Range   Sodium 136  135 - 145 mEq/L   Potassium 4.0  3.5 - 5.1 mEq/L   Chloride 101  96 - 112 mEq/L   CO2 27  19 - 32 mEq/L   Glucose, Bld 118 (*) 70 - 99 mg/dL   BUN 14  6 - 23 mg/dL   Creatinine, Ser 7.82  0.50 - 1.10 mg/dL   Calcium 9.4  8.4 - 95.6 mg/dL   GFR calc non Af Amer 60 (*) >90 mL/min   GFR calc Af Amer 70 (*) >90 mL/min   Comment:            The eGFR has been calculated     using the CKD EPI equation.     This calculation has not been     validated in all clinical     situations.     eGFR's persistently     <90 mL/min signify     possible Chronic Kidney Disease.  HEPATIC FUNCTION PANEL     Status: Abnormal   Collection Time    01/30/13 11:57 PM      Result Value Range   Total Protein 7.3  6.0 - 8.3 g/dL   Albumin 3.7  3.5 - 5.2 g/dL   AST 14  0 - 37 U/L   ALT 9  0 - 35 U/L   Alkaline Phosphatase 151 (*) 39 - 117 U/L   Total Bilirubin 0.3  0.3 - 1.2 mg/dL   Bilirubin, Direct <2.1  0.0 - 0.3 mg/dL   Indirect Bilirubin NOT CALCULATED  0.3 - 0.9 mg/dL  POCT I-STAT TROPONIN I     Status: None   Collection Time    01/31/13 12:00 AM      Result Value Range   Troponin i, poc 0.00  0.00 - 0.08 ng/mL   Comment 3            Comment: Due to the release kinetics of cTnI,     a negative result within the first hours     of the onset of symptoms does not rule out     myocardial infarction with certainty.     If myocardial infarction is still suspected,     repeat the test at appropriate intervals.   Dg Chest 2 View  01/31/2013   *RADIOLOGY REPORT*  Clinical Data: The lower abdominal and vaginal pain starting 3 days ago.  Now with chest pain.  The sweats and chills.  CHEST - 2 VIEW  Comparison: 09/14/2012  Findings: Slightly shallow inspiration.  Heart size and pulmonary vascularity are normal for technique. Interval resolution of infiltrative changes in the right lung.  No focal airspace consolidation.  No blunting of costophrenic  angles.  No pneumothorax.  Tortuous aorta.  Degenerative changes in the spine and shoulders.  IMPRESSION: No evidence of active pulmonary disease.   Original Report Authenticated By: Burman Nieves, M.D.    Review of Systems  Constitutional: Positive for diaphoresis. Negative for fever, chills, weight loss and malaise/fatigue.  HENT: Negative for hearing loss, ear pain, nosebleeds, congestion, neck pain, tinnitus and ear discharge.   Eyes: Negative for blurred vision, double vision, photophobia and pain.  Respiratory: Positive for shortness of breath. Negative for cough, hemoptysis, sputum production, wheezing and stridor.   Cardiovascular: Positive for chest pain. Negative for palpitations, orthopnea, claudication, leg swelling and PND.  Gastrointestinal: Positive for nausea. Negative for heartburn, vomiting, abdominal pain, diarrhea, constipation, blood in stool and melena.  Genitourinary: Negative for dysuria, urgency, frequency, hematuria and flank pain.  Musculoskeletal: Negative for myalgias and back pain.  Skin: Negative for itching and rash.  Neurological: Negative for focal weakness, weakness and headaches.  Psychiatric/Behavioral: Negative for hallucinations.    Blood pressure 162/84, pulse 44, temperature 98.1 F (36.7 C), temperature source Oral, resp. rate 13, SpO2 99.00%. Physical Exam  Constitutional: She is oriented to person, place, and time. She appears well-developed and well-nourished. No distress.  HENT:  Head: Normocephalic.  Eyes: Pupils are equal, round, and reactive to light. Right eye exhibits no discharge. Left eye exhibits no discharge. No scleral icterus.  Neck: Normal range of motion. No JVD present. No thyromegaly present.  Cardiovascular: Regular rhythm.  Exam reveals no gallop and no friction rub.   No murmur heard. Bradycardic with regular rhythm  Respiratory: No stridor. No respiratory distress. She has no wheezes. She has no rales. She exhibits no  tenderness.  GI: She exhibits no distension. There is no tenderness. There is no rebound and no guarding.  Musculoskeletal: She exhibits no edema and no tenderness.  Neurological: She is alert and oriented to person, place, and time.  Skin: No rash noted. She is not diaphoretic. No erythema.     Assessment/Plan  1.  Chest pain:  We will admit the patient to cardiology service to be observed on telemetry.  We will continue Aspirin and Prasugrel. We will increase Lipitor from 20 mg to 80 mg qhs and increase Imdur from 120 mg qd to 150mg  qd. We will obtain serial cardiac markers to rule out MI and hold her beta-blockers since she is very concerned about her bradycardia.  If she rules in for MI, we will consider sending her to the cath lab  2. CAD s/p LAD stents. Patient is already on Aspirin, Lipitor and Imdur which will be continue.  3.  HTN: Currently not well-controlled.  We will consider adding Amlodipine to the patients regimen if the blood pressure is still elevated after taking the increased dose of Imdur.     Dezaria Methot E 01/31/2013, 2:13 AM

## 2013-01-31 NOTE — ED Notes (Signed)
MD at bedside. 

## 2013-01-31 NOTE — ED Notes (Signed)
Pt in Radiology 

## 2013-01-31 NOTE — Progress Notes (Addendum)
Pt. Seen and examined. Agree with the NP/PA-C note as written.  62 yo female with chronic pain from interstitial cystitis, who recently was diagnosed with UTI and has been on bactrim. She reports several days of nausea, vomiting and diarrhea. She had associated chest pain with this. It was apparently improved with nitroglycerin at home. EKG does not show changes concerning for ischemia. She had a recent NSTEMI in 08/2012. The other issue is that she is bradycardic. There is a possibility she was unintentionally taking both Coreg and Metoprolol. First cardiac enzyme is negative.   Impression: 1. Chest pain, with typical and atypical features for angina 2. Bradycardia - symptoms don't seem to correlate with this 3. UTI - leukocytosis 4. Pain from ?interstitial cystitis  Plan: 1. R/o ACS with cardiac enzymes. If negative, plan NST the am. If she rules-in, she will need a LHC. 2. Chest pain may have been related to N/V, she is on GI regimen 3. She wants more frequent percocet as her suprapubic and abdominal pain is not well controlled. History of chronic pain syndrome, fibromyalgia, ?pain medication seeking. 4. Continue bactrim for presumptive UTI started as an outpatient 2 days ago. 5. Hold b-blocker due to bradycardia.  Chrystie Nose, MD, [facility] Attending Cardiologist [facility]

## 2013-01-31 NOTE — ED Notes (Signed)
Consulted with Dr. Rulon Abide regarding this patient's bradycardia and drug therapy. Dr. Rulon Abide states additional drug therapy is not needed at this time. To consult with cardiology. zoll at the bedside.

## 2013-01-31 NOTE — Progress Notes (Signed)
CSW received referral for that pt request for home meds verification and clarity.   Inappropriate CSW referral.   Please re-consult if social work needs arise.    Jacklynn Lewis, MSW, LCSWA (weekend coverage [phone]) Clinical Social Work

## 2013-02-01 ENCOUNTER — Inpatient Hospital Stay (HOSPITAL_COMMUNITY): Payer: Medicare Other

## 2013-02-01 DIAGNOSIS — I200 Unstable angina: Secondary | ICD-10-CM

## 2013-02-01 DIAGNOSIS — N3020 Other chronic cystitis without hematuria: Secondary | ICD-10-CM

## 2013-02-01 DIAGNOSIS — G894 Chronic pain syndrome: Secondary | ICD-10-CM

## 2013-02-01 DIAGNOSIS — IMO0001 Reserved for inherently not codable concepts without codable children: Secondary | ICD-10-CM

## 2013-02-01 DIAGNOSIS — R079 Chest pain, unspecified: Secondary | ICD-10-CM

## 2013-02-01 DIAGNOSIS — I2510 Atherosclerotic heart disease of native coronary artery without angina pectoris: Secondary | ICD-10-CM

## 2013-02-01 DIAGNOSIS — I214 Non-ST elevation (NSTEMI) myocardial infarction: Secondary | ICD-10-CM

## 2013-02-01 MED ORDER — ATORVASTATIN CALCIUM 80 MG PO TABS: 80.0000 mg | ORAL_TABLET | Freq: Every day | ORAL | Status: AC

## 2013-02-01 MED ORDER — GI COCKTAIL ~~LOC~~
30.0000 mL | Freq: Three times a day (TID) | ORAL | Status: DC | PRN
  Administered 2013-02-01: 30 mL via ORAL
  Filled 2013-02-01: qty 30

## 2013-02-01 MED ORDER — TECHNETIUM TC 99M SESTAMIBI GENERIC - CARDIOLITE
30.0000 | Freq: Once | INTRAVENOUS | Status: AC | PRN
  Administered 2013-02-01: 30 via INTRAVENOUS

## 2013-02-01 MED ORDER — TECHNETIUM TC 99M SESTAMIBI GENERIC - CARDIOLITE
10.0000 | Freq: Once | INTRAVENOUS | Status: AC | PRN
  Administered 2013-02-01: 10 via INTRAVENOUS

## 2013-02-01 MED ORDER — PANTOPRAZOLE SODIUM 20 MG PO TBEC: 20.0000 mg | DELAYED_RELEASE_TABLET | Freq: Every day | ORAL | Status: DC

## 2013-02-01 NOTE — Discharge Summary (Signed)
Physician Discharge Summary  Patient ID: [name] MRN: [id number] DOB/AGE: 1951/03/26 62 y.o.  Admit date: 01/30/2013 Discharge date: 02/01/2013  Admission Diagnoses: Chest Pain  Discharge Diagnoses:  Active Problems:   CAD, LAD DES '05, four caths since have shown patent LAD, last cath 3/14 - Totally occluded --> PCI with Xience Expedition 2.75 mm x 33 mm, post-dil 3.1 mm    HTN (hypertension)   Hyperlipidemia   Unstable angina   Discharged Condition: stable  Hospital Course: The patient is a 62 y/o female, followed by Dr. Herbie Baltimore, with a PMH of CAD, who presented to [facility] on 01/30/13 for chest pain evaluation. She has a prior history of LAD stenting in 2005. She was previously admitted 09/2012 for NSTEMI resulting in a DES to LAD. The patient had been doing fine after discharge until the day prior to presentation when she woke up with chest pain. Her chest pain was described as a tightness, 8/10 in severity and was associated with nausea, diaphoresis and palpitations. She denied dizziness, syncope or orthopnea. She stated that her chest pain was similar to the chest pain that led to LAD stenting. It improved with SL NTG. In the ED, her EKG was without acute changes. She was noted to be bradycardic with a HR in the 40s. It was discovered that she was taking both Lopressor and Coreg by mistake. She was admitted for observation and to rule out a MI. Her BB was held and her bradycardia resolved. Cardiac enzymes were cycle and were negative x 3. Her chest pain resolved with more NTG. She underwent a NST that was negative for ischemia. She had moderately reduced EF of 46 %. The study was reviewed by Dr. Allyson Sabal. He examined the patient and determined that she was stable for discharge home. She will follow up at [facility] with a MLP.    Consults: None  Significant Diagnostic Studies:  NST 02/01/13 IMPRESSION:  1. No evidence of myocardial ischemia.  2. Mild global hypokinesis, worst in the inferior  wall, with  estimated Q G S ejection fraction 46%. This has decreased  significantly since a prior examination from [facility]  04/28/2012 where the ejection fraction measured 67%.  Original Report Authenticated By: Hulan Saas, M.D.    Treatments: See Hospital Course  Discharge Exam: Blood pressure 142/62, pulse 74, temperature 98 F (36.7 C), temperature source Oral, resp. rate 17, height 5\' 4"  (1.626 m), weight 190 lb 7.6 oz (86.4 kg), SpO2 96.00%.  Disposition: 01-Home or Self Care  Discharge Orders   Future Appointments Provider Department Dept Phone   02/12/2013 10:20 AM Nada Boozer, NP [facility] [place] [phone]   Future Orders Complete By Expires     Diet - low sodium heart healthy  As directed     Increase activity slowly  As directed         Medication List    STOP taking these medications       ASPERCREME EX     metoprolol 100 MG tablet  Commonly known as:  LOPRESSOR     phenazopyridine 100 MG tablet  Commonly known as:  PYRIDIUM     traMADol 50 MG tablet  Commonly known as:  ULTRAM     valACYclovir 500 MG tablet  Commonly known as:  VALTREX      TAKE these medications       albuterol 108 (90 BASE) MCG/ACT inhaler  Commonly known as:  PROVENTIL HFA;VENTOLIN HFA  Inhale 2 puffs  into the lungs every 6 (six) hours as needed for wheezing.     aspirin EC 81 MG tablet  Take 81 mg by mouth daily.     atorvastatin 80 MG tablet  Commonly known as:  LIPITOR  Take 1 tablet (80 mg total) by mouth daily at 6 PM.     isosorbide mononitrate 120 MG 24 hr tablet  Commonly known as:  IMDUR  Take 120 mg by mouth daily.     losartan 100 MG tablet  Commonly known as:  COZAAR  Take 100 mg by mouth 2 (two) times daily.     nitroGLYCERIN 0.4 MG SL tablet  Commonly known as:  NITROSTAT  Place 0.4 mg under the tongue every 5 (five) minutes as needed for chest pain.     oxyCODONE-acetaminophen 5-325 MG per tablet   Commonly known as:  PERCOCET/ROXICET  Take 1 tablet by mouth every 6 (six) hours as needed for pain.     pantoprazole 20 MG tablet  Commonly known as:  PROTONIX  Take 1 tablet (20 mg total) by mouth daily.     prasugrel 10 MG Tabs  Commonly known as:  EFFIENT  Take 1 tablet (10 mg total) by mouth daily.     sulfamethoxazole-trimethoprim 800-160 MG per tablet  Commonly known as:  BACTRIM DS,SEPTRA DS  Take 1 tablet by mouth 2 (two) times daily.           Follow-up Information   Follow up with [facility], NP On 02/12/2013. (10:20 am )    Contact information:   [address] [place] [address] [phone]      TIME SPENT ON DISCHARGE, INCLUDING PHYSICIAN TIME: > 30 MINUTES  Signed: Allayne Butcher, PA-C 02/01/2013, 4:32 PM

## 2013-02-01 NOTE — Progress Notes (Signed)
Subjective: "acid indigestion" last night.  It finally settled down at 0300hrs today.  Objective: Vital signs in last 24 hours: Temp:  [97.8 F (36.6 C)-99.4 F (37.4 C)] 98 F (36.7 C) (07/28 0520) Pulse Rate:  [50-58] 50 (07/28 0852) Resp:  [16-18] 17 (07/28 0520) BP: (116-150)/(66-72) 122/71 mmHg (07/28 0852) SpO2:  [94 %-96 %] 96 % (07/28 0520) Last BM Date: 01/31/13  Intake/Output from previous day: 07/27 0701 - 07/28 0700 In: 540 [P.O.:540] Out: 1 [Urine:1] Intake/Output this shift:    Medications Current Facility-Administered Medications  Medication Dose Route Frequency Provider Last Rate Last Dose  . albuterol (PROVENTIL HFA;VENTOLIN HFA) 108 (90 BASE) MCG/ACT inhaler 2 puff  2 puff Inhalation Q6H PRN John-Adam Bonk, MD      . aspirin EC tablet 81 mg  81 mg Oral Daily John-Adam Bonk, MD   81 mg at 01/31/13 0958  . atorvastatin (LIPITOR) tablet 80 mg  80 mg Oral q1800 John-Adam Bonk, MD   80 mg at 01/31/13 1752  . enoxaparin (LOVENOX) injection 40 mg  40 mg Subcutaneous Q24H John-Adam Bonk, MD   40 mg at 02/01/13 0600  . gi cocktail (Maalox,Lidocaine,Donnatal)  30 mL Oral TID PRN Leeann Must, MD   30 mL at 02/01/13 0210  . isosorbide mononitrate (IMDUR) 24 hr tablet 120 mg  120 mg Oral Daily John-Adam Bonk, MD   120 mg at 01/31/13 0958  . losartan (COZAAR) tablet 100 mg  100 mg Oral BID John-Adam Bonk, MD   100 mg at 01/31/13 2248  . nitroGLYCERIN (NITROSTAT) SL tablet 0.4 mg  0.4 mg Sublingual Q5 min PRN John-Adam Bonk, MD   0.4 mg at 01/31/13 0008  . nitroGLYCERIN (NITROSTAT) SL tablet 0.4 mg  0.4 mg Sublingual Q5 min PRN John-Adam Bonk, MD      . ondansetron (ZOFRAN) injection 4 mg  4 mg Intravenous Q6H PRN John-Adam Bonk, MD   4 mg at 01/31/13 2039  . oxyCODONE-acetaminophen (PERCOCET/ROXICET) 5-325 MG per tablet 1 tablet  1 tablet Oral Q4H PRN Chrystie Nose, MD   1 tablet at 02/01/13 0113  . pantoprazole (PROTONIX) EC tablet 20 mg  20 mg Oral Daily Brittainy  Simmons, PA-C   20 mg at 01/31/13 1536  . prasugrel (EFFIENT) tablet 10 mg  10 mg Oral Daily John-Adam Bonk, MD   10 mg at 01/31/13 0957  . sulfamethoxazole-trimethoprim (BACTRIM DS,SEPTRA DS) 800-160 MG per tablet 1 tablet  1 tablet Oral BID Jones Skene, MD   1 tablet at 01/31/13 2249  . traMADol (ULTRAM) tablet 50-100 mg  50-100 mg Oral Q6H PRN Marykay Lex, MD   100 mg at 01/31/13 2345    PE: General appearance: alert, cooperative and no distress Lungs: clear to auscultation bilaterally Heart: regular rate and rhythm, S1, S2 normal, no murmur, click, rub or gallop Extremities: No LEE Pulses: 2+ and symmetric Neurologic: Grossly normal  Lab Results:   Recent Labs  01/30/13 2357 01/31/13 0555  WBC 11.6* 14.0*  HGB 12.0 12.0  HCT 36.5 37.1  PLT 408* 385   BMET  Recent Labs  01/30/13 2357 01/31/13 0555  NA 136 136  K 4.0 4.1  CL 101 101  CO2 27 24  GLUCOSE 118* 117*  BUN 14 13  CREATININE 0.99 0.97  CALCIUM 9.4 9.3   PT/INR No results found for this basename: LABPROT, INR,  in the last 72 hours Cholesterol  Recent Labs  01/31/13 0555  CHOL 161  Lipid Panel     Component Value Date/Time   CHOL 161 01/31/2013 0555   TRIG 132 01/31/2013 0555   HDL 53 01/31/2013 0555   CHOLHDL 3.0 01/31/2013 0555   VLDL 26 01/31/2013 0555   LDLCALC 82 01/31/2013 0555   Cardiac Panel (last 3 results)  Recent Labs  01/31/13 1021 01/31/13 1450 01/31/13 2026  TROPONINI <0.30 <0.30 <0.30     Assessment/Plan  Active Problems:   CAD, LAD DES '05, four caths since have shown patent LAD, last cath 3/14 - Totally occluded --> PCI with Xience Expedition 2.75 mm x 33 mm, post-dil 3.1 mm    HTN (hypertension)   Hyperlipidemia   Unstable angina  Plan:  Lexiscan myoview today.  Ruled out for MI.  Tolerated Lexiscan well.  No acute EKG changes.  She did develop nausea, and acid reflux symptoms.  BP stable.     LOS: 2 days    HAGER, BRYAN 02/01/2013 9:18 AM   Agree  with note written by Jones Skene PAC  Neg MPI. Doubt ACS. Major c/o interstitial cystitis and reflux. Enz neg. Exam benign. OK for D/C home.   Runell Gess 02/01/2013 4:13 PM

## 2013-02-01 NOTE — Progress Notes (Signed)
Utilization review completed.  

## 2013-02-01 NOTE — Progress Notes (Signed)
INITIAL NUTRITION ASSESSMENT  DOCUMENTATION CODES Per approved criteria  -Obesity Unspecified   INTERVENTION:  No nutrition intervention at this time ---> patient declined RD to follow for nutrition care plan  NUTRITION DIAGNOSIS: Inadequate oral intake related to poor appetite as evidenced by patient report  Goal: Oral intake with meals to meet >/= 90% of estimated nutrition needs  Monitor:  PO intake, weight, labs, I/O's  Reason for Assessment: Malnutrition Screening Tool Report  62 y.o. female  Admitting Dx: chest pain  ASSESSMENT: Patient with PMH of CAD presented for chest pain.  Patient reports a poor appetite; PO intake variable at 0-75% per flowsheets; patient tells me she does get hungry but when she begins to eat she starts not feeling well; states she has a lot of reflux, gas and chronic pain from her fibromyalgia; no significant weight loss per records; RD offered supplement options ---> patient declined.  Height: Ht Readings from Last 1 Encounters:  01/31/13 5\' 4"  (1.626 m)    Weight: Wt Readings from Last 1 Encounters:  01/31/13 190 lb 7.6 oz (86.4 kg)    Ideal Body Weight: 120 lb  % Ideal Body Weight: 158%  Wt Readings from Last 10 Encounters:  01/31/13 190 lb 7.6 oz (86.4 kg)  11/05/12 189 lb 3.2 oz (85.821 kg)  09/17/12 199 lb 15.3 oz (90.7 kg)  09/17/12 199 lb 15.3 oz (90.7 kg)  08/14/09 205 lb 6.4 oz (93.169 kg)    Usual Body Weight: 199 lb  % Usual Body Weight: 95%  BMI:  Body mass index is 32.68 kg/(m^2).  Estimated Nutritional Needs: Kcal: 1700-1900 Protein: 80-90 gm Fluid: 1.7-1.9 L  Skin: Intact  Diet Order: Cardiac  EDUCATION NEEDS: -No education needs identified at this time   Intake/Output Summary (Last 24 hours) at 02/01/13 1426 Last data filed at 02/01/13 0600  Gross per 24 hour  Intake    420 ml  Output      1 ml  Net    419 ml    Labs:   Recent Labs Lab 01/30/13 2357 01/31/13 0555  NA 136 136  K 4.0  4.1  CL 101 101  CO2 27 24  BUN 14 13  CREATININE 0.99 0.97  CALCIUM 9.4 9.3  GLUCOSE 118* 117*     Scheduled Meds: . aspirin EC  81 mg Oral Daily  . atorvastatin  80 mg Oral q1800  . enoxaparin (LOVENOX) injection  40 mg Subcutaneous Q24H  . isosorbide mononitrate  120 mg Oral Daily  . losartan  100 mg Oral BID  . pantoprazole  20 mg Oral Daily  . prasugrel  10 mg Oral Daily  . sulfamethoxazole-trimethoprim  1 tablet Oral BID    Continuous Infusions:   Past Medical History  Diagnosis Date  . CAD in native artery 2005    Cyphe DES to LAD => 09/2012; stent totally occluded --> PCI with Xience Expedition 2.75 mm x 33 mm, post-dilated to 3.1 mm (smal Diag was jailed)  . Myocardial infarction of anterolateral wall   . Hyperlipidemia   . Hypertension   . Chronic back pain   . GERD (gastroesophageal reflux disease)   . Depression   . Stroke 2013  . Anxiety   . Asthma   . Pneumonia     history of   . Peripheral vascular disease   . Neuromuscular disorder   . Headache(784.0)   . Arthritis   . Fibromyalgia   . Anemia   . Constipation   .  Headache(784.0)   . Fatigue   . Allergy   . CAD in native artery '05- present    2D Echo - EF 55-65%, left atrium moderately dilated; Myocardial SPECT 12/08/2008 - EF 64%, no sign of ischemia or infarct    Past Surgical History  Procedure Laterality Date  . Hernia repair    . Abdominal hysterectomy    . Tummy tuck  1985  . Nose surgery  1985  . Cardiac catheterization Left 09/09/2012    Predilation balloon 2.5x15 Sprinter Legend, scroing balloon AngioSculpt 2.5x15, Xience Expidention 2.75x33 from proximal LAD across D2, post dilation [place] Trek 3x20  . Cosmetic surgery    . Cardiac catheterization Left 05/21/2010    LAD proximal first diagonal branch 70% ostial lesion  . Cardiac catheterization Left 06/13/2005    Obstructive disease of the first diagonal at stent site of LAD, continue medical therapy  . Cardiac catheterization  11/22/2003     70-80% ostial stenosis in proximal LAD beyond stented segment  . Cardiac catheterization  08/23/2003    Medical therapy  . Cardiac catheterization  08/16/2003    2.75x10 cutter for PCI, 3x13 Cypher drug-eluting stent deployed ar 13 atomspheres resulting in a reduction of a proximal 70-80% segmental stenosis to 0% residul    Maureen Chatters, RD, LDN Pager #: [phone] After-Hours Pager #: [phone]

## 2013-02-02 NOTE — Discharge Summary (Signed)
Agree with above.  Runell Gess, M.D., [facility] [address] [place], [place]  [address]  [phone] 02/02/2013 6:11 AM

## 2013-02-12 ENCOUNTER — Ambulatory Visit: Payer: Medicare Other | Admitting: Cardiology

## 2013-02-17 ENCOUNTER — Encounter: Payer: Self-pay | Admitting: Cardiology

## 2013-02-17 ENCOUNTER — Ambulatory Visit (INDEPENDENT_AMBULATORY_CARE_PROVIDER_SITE_OTHER): Payer: Medicare Other | Admitting: Cardiology

## 2013-02-17 VITALS — BP 144/90 | HR 64 | Ht 64.0 in | Wt 192.1 lb

## 2013-02-17 DIAGNOSIS — I2510 Atherosclerotic heart disease of native coronary artery without angina pectoris: Secondary | ICD-10-CM

## 2013-02-17 DIAGNOSIS — I498 Other specified cardiac arrhythmias: Secondary | ICD-10-CM

## 2013-02-17 DIAGNOSIS — R001 Bradycardia, unspecified: Secondary | ICD-10-CM

## 2013-02-17 DIAGNOSIS — I10 Essential (primary) hypertension: Secondary | ICD-10-CM

## 2013-02-17 MED ORDER — PANTOPRAZOLE SODIUM 20 MG PO TBEC: 40.0000 mg | DELAYED_RELEASE_TABLET | Freq: Every day | ORAL | Status: DC

## 2013-02-17 MED ORDER — AMLODIPINE BESYLATE 10 MG PO TABS: 10.0000 mg | ORAL_TABLET | Freq: Every day | ORAL | Status: DC

## 2013-02-17 NOTE — Assessment & Plan Note (Signed)
No chest pain today, denies any chest pain. No shortness of breath. She had a nuclear stress test during hospitalization that was negative for ischemia. She is taking her Effient and her aspirin.

## 2013-02-17 NOTE — Patient Instructions (Signed)
Use 2 of 20 mg tabs of Protonix daily and stop the omeprazole (prilosec).  I increased your amlodipine to 10 mg daily for elevated BP.   See Dr. Herbie Baltimore in 3 weeks for follow up.

## 2013-02-17 NOTE — Assessment & Plan Note (Signed)
As her discharge her blood pressure was elevated and Norvasc 5 mg was added to her medical regimen today continues to be elevated and I increased her amlodipine to 10 mg daily.

## 2013-02-17 NOTE — Assessment & Plan Note (Signed)
Patient was recently hospitalized for chest pain and was found to be bradycardic with heart rates in the 40s this was because she was taking both Lopressor and Coreg by mistake. She admits to getting her medications confused at times. Her BB was held and continues to be held. She states she sometimes tells her heart beating rapidly but she still gets confused with her medications and I'm very hesitant to have back up beta blocker when she has been bradycardic in the past.

## 2013-02-17 NOTE — Progress Notes (Signed)
02/17/2013   PCP: Aida Puffer, MD   Chief Complaint  Patient presents with  . Follow-up    post hosp 7/26-28; CP & acid reflux; denies CP/SOB; excessive daytime sleepiness; always LE edema    Primary Cardiologist: Dr. Herbie Baltimore  HPI:  62 year old white female with a prior history of LAD stent in 2005. She was previously admitted 09/2012 for NSTEMI resulting in DES to LAD. Patient had been doing fine after discharge until July 27th, she woke up with chest pain. Marland Kitchen Her EKG did not show ST- or T-wave changes to suggest ischemia. She is concerned about her bradycardia (heart rate in the 40's), but she denied dizziness or syncope. It appeared that she was taking Carvedilol and Metoprolol at the same time.  So both were stopped.  A nuclear stress test which was negative for ischemia and was felt stable for discharge home.  Since that time amlodipine has been added to her medical regimen for hypertension.  She is here today for followup. She's had no chest pain no shortness of breath. She still confused about medications. While she complained of some mild faster heart rates I'm hesitant to add beta blocker as she seems to get confused with her meds. Today she is on Prilosec and protime next because 20 Protonix Lantus not help. I increased the dose of proton extension 40 mg a day and told her to stop the Prilosec.    Her other complaint is left shoulder pain which is her bursitis I've asked her to follow up with her primary care physician Dr. Aida Puffer.   Allergies  Allergen Reactions  . Ciprofloxacin Hcl Itching  . Food Other (See Comments)    Fish, corn (per allergy testing)    Current Outpatient Prescriptions  Medication Sig Dispense Refill  . albuterol (PROVENTIL HFA;VENTOLIN HFA) 108 (90 BASE) MCG/ACT inhaler Inhale 2 puffs into the lungs every 6 (six) hours as needed for wheezing.  1 Inhaler  2  . ALPRAZolam (XANAX) 0.5 MG tablet Take 0.5 mg by mouth 4 (four) times daily as  needed for sleep.      Marland Kitchen aspirin EC 81 MG tablet Take 81 mg by mouth daily.      Marland Kitchen atorvastatin (LIPITOR) 80 MG tablet Take 1 tablet (80 mg total) by mouth daily at 6 PM.  30 tablet  5  . diphenhydrAMINE (BENADRYL) 25 MG tablet Take 25 mg by mouth every 6 (six) hours as needed for itching.      . Homeopathic Products (ALLERGY MEDICINE PO) Take by mouth as needed.      . isosorbide mononitrate (IMDUR) 120 MG 24 hr tablet Take 120 mg by mouth daily.      Marland Kitchen losartan (COZAAR) 100 MG tablet Take 100 mg by mouth 2 (two) times daily.      . naproxen sodium (ANAPROX) 220 MG tablet Take 440 mg by mouth 2 (two) times daily with a meal.      . nitroGLYCERIN (NITROSTAT) 0.4 MG SL tablet Place 0.4 mg under the tongue every 5 (five) minutes as needed for chest pain.      . pantoprazole (PROTONIX) 20 MG tablet Take 2 tablets (40 mg total) by mouth daily.  60 tablet  5  . prasugrel (EFFIENT) 10 MG TABS Take 1 tablet (10 mg total) by mouth daily.  30 tablet  0  . traMADol (ULTRAM) 50 MG tablet Take 50 mg by mouth every 6 (six) hours as needed for pain.      Marland Kitchen  amLODipine (NORVASC) 10 MG tablet Take 1 tablet (10 mg total) by mouth daily.  30 tablet  5   No current facility-administered medications for this visit.    Past Medical History  Diagnosis Date  . CAD in native artery 2005    Cyphe DES to LAD => 09/2012; stent totally occluded --> PCI with Xience Expedition 2.75 mm x 33 mm, post-dilated to 3.1 mm (smal Diag was jailed)  . Myocardial infarction of anterolateral wall   . Hyperlipidemia   . Hypertension   . Chronic back pain   . GERD (gastroesophageal reflux disease)   . Depression   . Stroke 2013  . Anxiety   . Asthma   . Pneumonia     history of   . Peripheral vascular disease   . Neuromuscular disorder   . Headache(784.0)   . Arthritis   . Fibromyalgia   . Anemia   . Constipation   . Headache(784.0)   . Fatigue   . Allergy   . CAD in native artery '05- present    2D Echo - EF 55-65%,  left atrium moderately dilated; Myocardial SPECT 12/08/2008 - EF 64%, no sign of ischemia or infarct    Past Surgical History  Procedure Laterality Date  . Hernia repair    . Abdominal hysterectomy    . Tummy tuck  1985  . Nose surgery  1985  . Cardiac catheterization Left 09/09/2012    Predilation balloon 2.5x15 Sprinter Legend, scroing balloon AngioSculpt 2.5x15, Xience Expidention 2.75x33 from proximal LAD across D2, post dilation [place] Trek 3x20  . Cosmetic surgery    . Cardiac catheterization Left 05/21/2010    LAD proximal first diagonal branch 70% ostial lesion  . Cardiac catheterization Left 06/13/2005    Obstructive disease of the first diagonal at stent site of LAD, continue medical therapy  . Cardiac catheterization  11/22/2003    70-80% ostial stenosis in proximal LAD beyond stented segment  . Cardiac catheterization  08/23/2003    Medical therapy  . Cardiac catheterization  08/16/2003    2.75x10 cutter for PCI, 3x13 Cypher drug-eluting stent deployed ar 13 atomspheres resulting in a reduction of a proximal 70-80% segmental stenosis to 0% residul    WUJ:WJXBJYN:WG colds or fevers, no weight changes Skin:no rashes or ulcers HEENT:no blurred vision, no congestion CV:see HPI PUL:see HPI GI:no diarrhea constipation or melena, no indigestion GU:no hematuria, no dysuria MS:+ Lt shoulder joint pain, no claudication Neuro:no syncope, no lightheadedness Endo:no diabetes, no thyroid disease  PHYSICAL EXAM BP 144/90  Pulse 64  Ht 5\' 4"  (1.626 m)  Wt 192 lb 1.6 oz (87.136 kg)  BMI 32.96 kg/m2 General:Pleasant affect, NAD Skin:Warm and dry, brisk capillary refill HEENT:normocephalic, sclera clear, mucus membranes moist Neck:supple, no JVD, no bruits  Heart:S1S2 RRR without murmur, gallup, rub or click Lungs:clear without rales, rhonchi, or wheezes NFA:OZHY, non tender, + BS, do not palpate liver spleen or masses Ext:no lower ext edema, 2+ pedal pulses, 2+ radial pulses Neuro:alert  and oriented, MAE, follows commands, + facial symmetry   ASSESSMENT AND PLAN Symptomatic bradycardia Patient was recently hospitalized for chest pain and was found to be bradycardic with heart rates in the 40s this was because she was taking both Lopressor and Coreg by mistake. She admits to getting her medications confused at times. Her BB was held and continues to be held. She states she sometimes tells her heart beating rapidly but she still gets confused with her medications and I'm very hesitant to  have back up beta blocker when she has been bradycardic in the past.  HTN (hypertension) As her discharge her blood pressure was elevated and Norvasc 5 mg was added to her medical regimen today continues to be elevated and I increased her amlodipine to 10 mg daily.  CAD, LAD DES '05, four caths since have shown patent LAD, last cath 3/14 - Totally occluded --> PCI with Xience Expedition 2.75 mm x 33 mm, post-dil 3.1 mm  No chest pain today, denies any chest pain. No shortness of breath. She had a nuclear stress test during hospitalization that was negative for ischemia. She is taking her Effient and her aspirin.   Please note on Bradycardia, ER note stated no symptoms, today she states she was dizzy at times.  On her next visit if she is taking appropriate meds, maybe BB can be added back at a low dose.

## 2013-03-02 ENCOUNTER — Telehealth: Payer: Self-pay | Admitting: Cardiology

## 2013-03-02 NOTE — Telephone Encounter (Signed)
Please call-she needs aletter for her insurance company-she will give you what she needs when you call.

## 2013-03-02 NOTE — Telephone Encounter (Signed)
Message forwarded to S. Martin, RN.  

## 2013-03-03 NOTE — Telephone Encounter (Signed)
Return call. No answer left message to callback.

## 2013-03-03 NOTE — Telephone Encounter (Signed)
Pt return call. She states she need a letter written to [employer]. She said that she missed payment on her car insurance in June because she forgot.[employer] is taking her plates.  They told her if she had a letter from a physician stating she has a memory issue due to her stroke. Informed her that she  has cancelled  Most of her appointments since discharge except one with an extender. Dr Herbie Baltimore will not  Be able to write a letter because he has not seen her. Pt states she saw Vernona Rieger NP. Informed her that I will defer to her and call the patient with an answer back.

## 2013-03-03 NOTE — Telephone Encounter (Signed)
Informed patient that Dr Herbie Baltimore will not able to do a letter. She should contact pcp or neurologist

## 2013-03-03 NOTE — Telephone Encounter (Signed)
I really think that a note about her memory issues & CVA should come from her PCP or Neurologist.   I do not evaluate or treat those symptoms.  Marykay Lex, MD

## 2013-03-12 DIAGNOSIS — M722 Plantar fascial fibromatosis: Secondary | ICD-10-CM | POA: Insufficient documentation

## 2013-03-12 DIAGNOSIS — G609 Hereditary and idiopathic neuropathy, unspecified: Secondary | ICD-10-CM | POA: Insufficient documentation

## 2013-03-15 ENCOUNTER — Ambulatory Visit: Payer: Medicare Other | Admitting: Cardiology

## 2013-03-23 ENCOUNTER — Telehealth: Payer: Self-pay | Admitting: Cardiology

## 2013-03-23 NOTE — Telephone Encounter (Signed)
Returned call.  Pt stated she is so dizzy she is staggering again.  Stated it's like it was after she had the last stroke.  Stated she wanted to talk to Belenda Cruise, the pharmacist, to go over her medicine list.  Pt informed Belenda Cruise is seeing patients now and the message can be sent, but her call may not be returned until later.  Pt informed RN can go over her med list now.  Pt stated she is outside and will need to call back.  Pt informed she can call back and leave a message.  RN will call her back and message will be forwarded to Belenda Cruise, PharmD.  Pt verbalized understanding and agreed w/ plan.  Message forwarded to K. Alvstad, PharmD.

## 2013-03-23 NOTE — Telephone Encounter (Signed)
This message from the answering service-Pt says something she is taking is making her drunk.

## 2013-03-23 NOTE — Telephone Encounter (Signed)
Pt returned call, after listening to symptoms form pt, she admitted that she started Lyrica about 7-10 days ago.  I explained that all of her symptoms relate to Lyrica and that she needs to talk to whichever MD started that medicine.  She states that she felt bad before starting, but has been worse since.  Explained that we cannot change any of her cardiac meds, as her symptoms don't fit any of these meds.  Advised her to discuss with other MD, and that if she can tolerate it, the side effects from Lyrica may fade over the next few weeks.

## 2013-03-30 ENCOUNTER — Encounter: Payer: Self-pay | Admitting: Cardiology

## 2013-03-30 ENCOUNTER — Ambulatory Visit (INDEPENDENT_AMBULATORY_CARE_PROVIDER_SITE_OTHER): Payer: Medicare Other | Admitting: Cardiology

## 2013-03-30 VITALS — BP 130/80 | HR 61 | Ht 64.0 in | Wt 198.5 lb

## 2013-03-30 DIAGNOSIS — I10 Essential (primary) hypertension: Secondary | ICD-10-CM

## 2013-03-30 DIAGNOSIS — E669 Obesity, unspecified: Secondary | ICD-10-CM | POA: Insufficient documentation

## 2013-03-30 DIAGNOSIS — R001 Bradycardia, unspecified: Secondary | ICD-10-CM

## 2013-03-30 DIAGNOSIS — I2510 Atherosclerotic heart disease of native coronary artery without angina pectoris: Secondary | ICD-10-CM

## 2013-03-30 DIAGNOSIS — F341 Dysthymic disorder: Secondary | ICD-10-CM

## 2013-03-30 DIAGNOSIS — I214 Non-ST elevation (NSTEMI) myocardial infarction: Secondary | ICD-10-CM

## 2013-03-30 DIAGNOSIS — Z79899 Other long term (current) drug therapy: Secondary | ICD-10-CM

## 2013-03-30 DIAGNOSIS — E785 Hyperlipidemia, unspecified: Secondary | ICD-10-CM

## 2013-03-30 DIAGNOSIS — I498 Other specified cardiac arrhythmias: Secondary | ICD-10-CM

## 2013-03-30 DIAGNOSIS — F329 Major depressive disorder, single episode, unspecified: Secondary | ICD-10-CM

## 2013-03-30 MED ORDER — CLOPIDOGREL BISULFATE 75 MG PO TABS: 75.0000 mg | ORAL_TABLET | Freq: Every day | ORAL | Status: AC

## 2013-03-30 NOTE — Progress Notes (Signed)
PCP: Aida Puffer, MD  Clinic Note: Chief Complaint  Patient presents with  . 3 week visit    no chest pain , no sob, no swelling, recently had injection for plantar facitis,dizziness, staggering   HPI: [name] is a 62 y.o. female with a PMH below who presents today for six-month followup of her coronary disease. She PCI to LAD in 2005. Since then she's had several catheterizations showing patent stent. The last was in March of 2014 when she had a small troponin elevation. She's been found to have what appeared to be a chronically occluded proximal LAD at the previous stent site. This is treated with a DES stent. Should resolve, PAS 6 months ago with a plan to see me back in several weeks but is now 6 months out. Shashi saw Nada Boozer a month ago following hospitalization for profound bradycardia with heart rates in the 40s. Currently she is taking both metoprolol and Coreg. At that time the decision was made that she may not do well with beta blockers as her confusion with medications almost led to a toxic side effect. Amlodipine was started for hypertension and then Vernona Rieger increased it to 10 mg last month.  Interval History: She will he has not had much in the way of any significant chest discomfort symptoms with rest or exertion. She does definitely have depression symptoms which makes getting a good story for her difficult. She says she feels tired all the time. She did try to do walking and exercise but does hasn't felt like doing it. She notes some leg cramping intermittently but nothing significant.  The remainder of Cardiovascular ROS: no chest pain or dyspnea on exertion positive for - Fatigue negative for - edema, irregular heartbeat, loss of consciousness, murmur, orthopnea, palpitations, paroxysmal nocturnal dyspnea, rapid heart rate or shortness of breath is as follows: Additional cardiac review of systems: Lightheadedness /dizziness - occasionally, but nothing significant.  Sometimes positional,  syncope/near-syncope - no; TIA/amaurosis fugax - no Melena - no, hematochezia no; hematuria - no; nosebleeds - no; claudication - no  In July she was evaluated for chest discomfort and was found to have no ischemia on Myoview, EF was 46%, mild breast attenuation  Past Medical History  Diagnosis Date  . CAD in native artery 2005    Cyphe DES to LAD => 09/2012; stent totally occluded --> PCI with Xience Expedition 2.75 mm x 33 mm, post-dilated to 3.1 mm (smal Diag was jailed)  . Myocardial infarction of anterolateral wall 2005, 09/2012    March 2014 non-STEMI (likely missed STEMI -- PCI to LAD in-stent stenosis less thrombosis Xience Xpedition 2.75 mm 32 mm and proximal LAD crossing D2. (3.0 mm)  . Hyperlipidemia   . Hypertension   . Chronic back pain   . GERD (gastroesophageal reflux disease)   . Depression   . Stroke 2013  . Anxiety   . Asthma   . Pneumonia     history of   . Peripheral vascular disease   . Neuromuscular disorder   . Headache(784.0)   . Arthritis   . Fibromyalgia   . Anemia   . Constipation   . Headache(784.0)   . Fatigue   . Allergy   . H/O echocardiogram '05- present    2D Echo - EF 55-65%, left atrium moderately dilated; Myocardial SPECT 12/08/2008 - EF 64%, no sign of ischemia or infarct  . History of nuclear stress test  July 2014    EF 46%, no ischemia  or infarction. Breast attenuation   Prior Cardiac Evaluation and Past Surgical History: Reviewed on Epic  Allergies  Allergen Reactions  . Ciprofloxacin Hcl Itching  . Food Other (See Comments)    Fish, corn (per allergy testing)    Current Outpatient Prescriptions  Medication Sig Dispense Refill  . albuterol (PROVENTIL HFA;VENTOLIN HFA) 108 (90 BASE) MCG/ACT inhaler Inhale 2 puffs into the lungs every 6 (six) hours as needed for wheezing.  1 Inhaler  2  . ALPRAZolam (XANAX) 0.5 MG tablet Take 0.5 mg by mouth 4 (four) times daily as needed for sleep.      Marland Kitchen amLODipine (NORVASC)  10 MG tablet Take 1 tablet (10 mg total) by mouth daily.  30 tablet  5  . aspirin EC 81 MG tablet Take 81 mg by mouth daily.      Marland Kitchen atorvastatin (LIPITOR) 80 MG tablet Take 1 tablet (80 mg total) by mouth daily at 6 PM.  30 tablet  5  . buPROPion (WELLBUTRIN SR) 150 MG 12 hr tablet Take 150 mg by mouth daily.      . carisoprodol (SOMA) 350 MG tablet Take 350 mg by mouth 4 (four) times daily as needed for muscle spasms.      . diazepam (VALIUM) 10 MG tablet Take 10 mg by mouth daily.      . diphenhydrAMINE (BENADRYL) 25 MG tablet Take 25 mg by mouth every 6 (six) hours as needed for itching.      . Homeopathic Products (ALLERGY MEDICINE PO) Take by mouth as needed.      . isosorbide mononitrate (IMDUR) 120 MG 24 hr tablet Take 120 mg by mouth daily.      Marland Kitchen losartan (COZAAR) 100 MG tablet Take 100 mg by mouth 2 (two) times daily.      . naproxen sodium (ANAPROX) 220 MG tablet Take 440 mg by mouth 2 (two) times daily with a meal.      . nitroGLYCERIN (NITROSTAT) 0.4 MG SL tablet Place 0.4 mg under the tongue every 5 (five) minutes as needed for chest pain.      . pantoprazole (PROTONIX) 20 MG tablet Take 2 tablets (40 mg total) by mouth daily.  60 tablet  5  . pentosan polysulfate (ELMIRON) 100 MG capsule Take 100 mg by mouth 3 (three) times daily before meals.      . pregabalin (LYRICA) 75 MG capsule Take 75 mg by mouth 2 (two) times daily.       Marland Kitchen tioconazole (MONISTAT 1) 6.5 % vaginal ointment Place 1 applicator vaginally as needed.      . traMADol (ULTRAM) 50 MG tablet Take 50 mg by mouth every 6 (six) hours as needed for pain.      Marland Kitchen clopidogrel (PLAVIX) 75 MG tablet Take 1 tablet (75 mg total) by mouth daily.  90 tablet  3   No current facility-administered medications for this visit.   Plavix is new from this visit, had been on Prasugrel 10 mg  History   Social History Narrative   She is divorced mother of one, grandmother of one. She does not smoke. She does not drink alcohol. She is  nonreactive but is trying to walk more. She is currently living with her son and is under lots of social stresses. He is wanting her to move back out on her own. She has not been on her own since her MI in March.   ROS: A comprehensive Review of Systems - Negative except Depression/dysthymia, and  fatigue. She is also noting thirsty bloated. She recently had injections for plantar fasciitis. she also notes pain in her left shoulder. Denies claudication. She does complain about significant bruising  PHYSICAL EXAM BP 130/80  Pulse 61  Ht 5\' 4"  (1.626 m)  Wt 198 lb 8 oz (90.039 kg)  BMI 34.06 kg/m2 General appearance: alert, cooperative, appears stated age, no distress and Depressed mood and affect. Neck: no adenopathy, no carotid bruit, no JVD and supple, symmetrical, trachea midline Lungs: clear to auscultation bilaterally, normal percussion bilaterally and Nonlabored, good air movement Heart: regular rate and rhythm, S1, S2 normal, no murmur, click, rub or gallop and normal apical impulse Abdomen: soft, non-tender; bowel sounds normal; no masses,  no organomegaly Extremities: extremities normal, atraumatic, no cyanosis or edema Pulses: 2+ and symmetric Neurologic: Grossly normal  WUJ:[id number] today: Yes Rate: 61 , Rhythm: NSR, LVH, otherwise normal  Recent Labs: I have reviewed all lab results (Scanned in Epic) which are normal or stable. Last Lipid panel 01/2013, LDL of 82 not quite at goal.  ASSESSMENT / PLAN: CAD, LAD DES '05, four caths since have shown patent LAD, last cath 3/14 - Totally occluded --> PCI with Xience Expedition 2.75 mm x 33 mm, post-dil 3.1 mm  No active symptoms. Negative recent stress test. Significant bruising noted on Effient, we'll switch to Plavix for cost and bruising issues. Continue aspirin 81 mg. We'll check P2Y12 assay to ensure adequate coverage. Not on beta blocker do to profound bradycardia  NSTEMI (non-ST elevated myocardial infarction) Thankfully  ejection fraction was not reduced, and no evidence of scar on stress test. DAPT for a minimum of 1 year  Obesity (BMI 30.0-34.9) Continue to encourage diet and exercise. She notes that she only eats a one meal a day. This would require her to simply increase her exercise level and her to burn calories. A lot of this has to of anhedonia secondary to depression.  Hyperlipidemia On atorvastatin, LDL is close to goal. Continue current management  HTN (hypertension) Blood pressure is good today. Heart rate is also pretty good today. I agree that we'll hold off on beta blockers for now and continue ARB plus non-dihydropyridine calcium channel blocker  Anxiety and depression These symptoms are simply limiting her well-being due to her lack of motivation to do any type of exercise.  I think she definitely needs counseling and potential adjustment of her medications. I really worry about what can happen if she does removed out by her son.   Orders Placed This Encounter  Procedures  . Platelet inhibition p2y12    Standing Status: Future     Number of Occurrences:      Standing Expiration Date: 05/24/2013  . EKG 12-Lead    Scheduling Instructions:     [facility] [place]    Order Specific Question:  Where should this test be performed    Answer:  OTHER   Followup: 6 months with M.D., 3 months with Nada Boozer, NP  DAVID W. Herbie Baltimore, M.D., M.S. [facility] [address] [place], [place]  [address]  [phone] Pager # [phone]

## 2013-03-30 NOTE — Patient Instructions (Signed)
We are going to switch over to Plavix from Effient -- instead of getting Effient refilled - after this month, you will start Plavix (for less bruising)  You cholesterol looked good back in July & you also had a NORMAL Stress Test.  I will see you back in 6 month, but will have you see  Lady Saucier in ~ 3 months just to see how things are going & recheck your cholesterol.  Marykay Lex, MD

## 2013-04-01 ENCOUNTER — Encounter: Payer: Self-pay | Admitting: Podiatrist

## 2013-04-01 ENCOUNTER — Encounter: Payer: Self-pay | Admitting: Cardiology

## 2013-04-01 DIAGNOSIS — M722 Plantar fascial fibromatosis: Secondary | ICD-10-CM

## 2013-04-01 DIAGNOSIS — G609 Hereditary and idiopathic neuropathy, unspecified: Secondary | ICD-10-CM

## 2013-04-04 ENCOUNTER — Encounter: Payer: Self-pay | Admitting: Cardiology

## 2013-04-04 DIAGNOSIS — E669 Obesity, unspecified: Secondary | ICD-10-CM | POA: Insufficient documentation

## 2013-04-04 NOTE — Assessment & Plan Note (Signed)
Thankfully ejection fraction was not reduced, and no evidence of scar on stress test. DAPT for a minimum of 1 year

## 2013-04-04 NOTE — Assessment & Plan Note (Signed)
These symptoms are simply limiting her well-being due to her lack of motivation to do any type of exercise.  I think she definitely needs counseling and potential adjustment of her medications. I really worry about what can happen if she does removed out by her son.

## 2013-04-04 NOTE — Assessment & Plan Note (Signed)
On atorvastatin, LDL is close to goal. Continue current management

## 2013-04-04 NOTE — Assessment & Plan Note (Addendum)
No active symptoms. Negative recent stress test. Significant bruising noted on Effient, we'll switch to Plavix for cost and bruising issues. Continue aspirin 81 mg. We'll check P2Y12 assay to ensure adequate coverage. Not on beta blocker do to profound bradycardia

## 2013-04-04 NOTE — Assessment & Plan Note (Signed)
Blood pressure is good today. Heart rate is also pretty good today. I agree that we'll hold off on beta blockers for now and continue ARB plus non-dihydropyridine calcium channel blocker

## 2013-04-04 NOTE — Assessment & Plan Note (Signed)
Continue to encourage diet and exercise. She notes that she only eats a one meal a day. This would require her to simply increase her exercise level and her to burn calories. A lot of this has to of anhedonia secondary to depression.

## 2013-04-09 ENCOUNTER — Encounter: Payer: Self-pay | Admitting: Podiatrist

## 2013-04-09 ENCOUNTER — Ambulatory Visit (INDEPENDENT_AMBULATORY_CARE_PROVIDER_SITE_OTHER): Payer: Medicare Other | Admitting: Podiatrist

## 2013-04-09 VITALS — BP 128/82 | HR 76 | Resp 16 | Ht 64.0 in | Wt 198.0 lb

## 2013-04-09 DIAGNOSIS — M722 Plantar fascial fibromatosis: Secondary | ICD-10-CM

## 2013-04-09 DIAGNOSIS — G629 Polyneuropathy, unspecified: Secondary | ICD-10-CM

## 2013-04-09 DIAGNOSIS — G589 Mononeuropathy, unspecified: Secondary | ICD-10-CM

## 2013-04-09 MED ORDER — TRIAMCINOLONE ACETONIDE 40 MG/ML IJ SUSP
20.0000 mg | Freq: Once | INTRAMUSCULAR | Status: AC
  Administered 2013-04-09: 20 mg

## 2013-04-09 MED ORDER — PREGABALIN 75 MG PO CAPS: 75.0000 mg | ORAL_CAPSULE | Freq: Three times a day (TID) | ORAL | Status: DC

## 2013-04-09 NOTE — Patient Instructions (Signed)
Continue wearing your braces for both feet.  Continue with stretching exercises as well.  Call if symptoms fail to improve Plantar Fasciitis Plantar fasciitis is a common condition that causes foot pain. It is soreness (inflammation) of the band of tough fibrous tissue on the bottom of the foot that runs from the heel bone (calcaneus) to the ball of the foot. The cause of this soreness may be from excessive standing, poor fitting shoes, running on hard surfaces, being overweight, having an abnormal walk, or overuse (this is common in runners) of the painful foot or feet. It is also common in aerobic exercise dancers and ballet dancers. SYMPTOMS  Most people with plantar fasciitis complain of:  Severe pain in the morning on the bottom of their foot especially when taking the first steps out of bed. This pain recedes after a few minutes of walking.  Severe pain is experienced also during walking following a long period of inactivity.  Pain is worse when walking barefoot or up stairs DIAGNOSIS   Your caregiver will diagnose this condition by examining and feeling your foot.  Special tests such as X-rays of your foot, are usually not needed. PREVENTION   Consult a sports medicine professional before beginning a new exercise program.  Walking programs offer a good workout. With walking there is a lower chance of overuse injuries common to runners. There is less impact and less jarring of the joints.  Begin all new exercise programs slowly. If problems or pain develop, decrease the amount of time or distance until you are at a comfortable level.  Wear good shoes and replace them regularly.  Stretch your foot and the heel cords at the back of the ankle (Achilles tendon) both before and after exercise.  Run or exercise on even surfaces that are not hard. For example, asphalt is better than pavement.  Do not run barefoot on hard surfaces.  If using a treadmill, vary the incline.  Do not  continue to workout if you have foot or joint problems. Seek professional help if they do not improve. HOME CARE INSTRUCTIONS   Avoid activities that cause you pain until you recover.  Use ice or cold packs on the problem or painful areas after working out.  Only take over-the-counter or prescription medicines for pain, discomfort, or fever as directed by your caregiver.  Soft shoe inserts or athletic shoes with air or gel sole cushions may be helpful.  If problems continue or become more severe, consult a sports medicine caregiver or your own health care provider. Cortisone is a potent anti-inflammatory medication that may be injected into the painful area. You can discuss this treatment with your caregiver. MAKE SURE YOU:   Understand these instructions.  Will watch your condition.  Will get help right away if you are not doing well or get worse. Document Released: 03/19/2001 Document Revised: 09/16/2011 Document Reviewed: 05/18/2008 [facility] Patient Information 2014 [place], [place].

## 2013-04-09 NOTE — Progress Notes (Signed)
Subjective: She presents today for followup of plantar fasciitis bilateral. Patient states the pain is better on the left however it unresolved on the right. Patient states she has been taking her Lyrica as instructed however she feels like it's not enough. She has also been utilizing her plantar fascial braces and relates that they have been helping her significantly. Objective: Neurovascular status is intact and unchanged. Pain on palpation plantar medial aspect of the right heel consistent with chronic plantar fasciitis symptomatology is noted. Decreased discomfort on the plantar medial aspect of left heel is also noted. Assessment: Plantar fasciitis right Plan:  Discussed etiology, pathology, conservative vs. Surgical therapies and at this time a plantar fascial injection was recommended.  The patient agreed and a sterile skin prep was applied.  An injection consisting of kenalog and marcaine mixture was infiltrated at the point of maximal tenderness on the right Heel.  The patient tolerated this well and was given instructions for aftercare. I also increased her Lyrica to 3 times a day. Patient will be seen back when necessary

## 2013-08-14 ENCOUNTER — Other Ambulatory Visit: Payer: Self-pay | Admitting: Cardiology

## 2013-09-06 ENCOUNTER — Other Ambulatory Visit: Payer: Self-pay | Admitting: Podiatrist

## 2013-09-08 ENCOUNTER — Other Ambulatory Visit: Payer: Self-pay | Admitting: Cardiology

## 2013-09-09 NOTE — Telephone Encounter (Signed)
Rx was sent to pharmacy electronically. 

## 2014-01-03 ENCOUNTER — Other Ambulatory Visit: Payer: Self-pay | Admitting: Cardiology

## 2014-01-03 NOTE — Telephone Encounter (Signed)
Rx refill sent to patient pharmacy   

## 2014-02-11 ENCOUNTER — Encounter: Payer: Self-pay | Admitting: Gastroenterology

## 2014-04-08 IMAGING — CR DG CHEST 1V PORT
1 series · 1 of 1 positions shown · non-contrast
Comparison: 09/02/2012

CLINICAL DATA: Hypotension, chest pain, shortness of breath,
dizziness

PORTABLE CHEST - 1 VIEW

[AP]
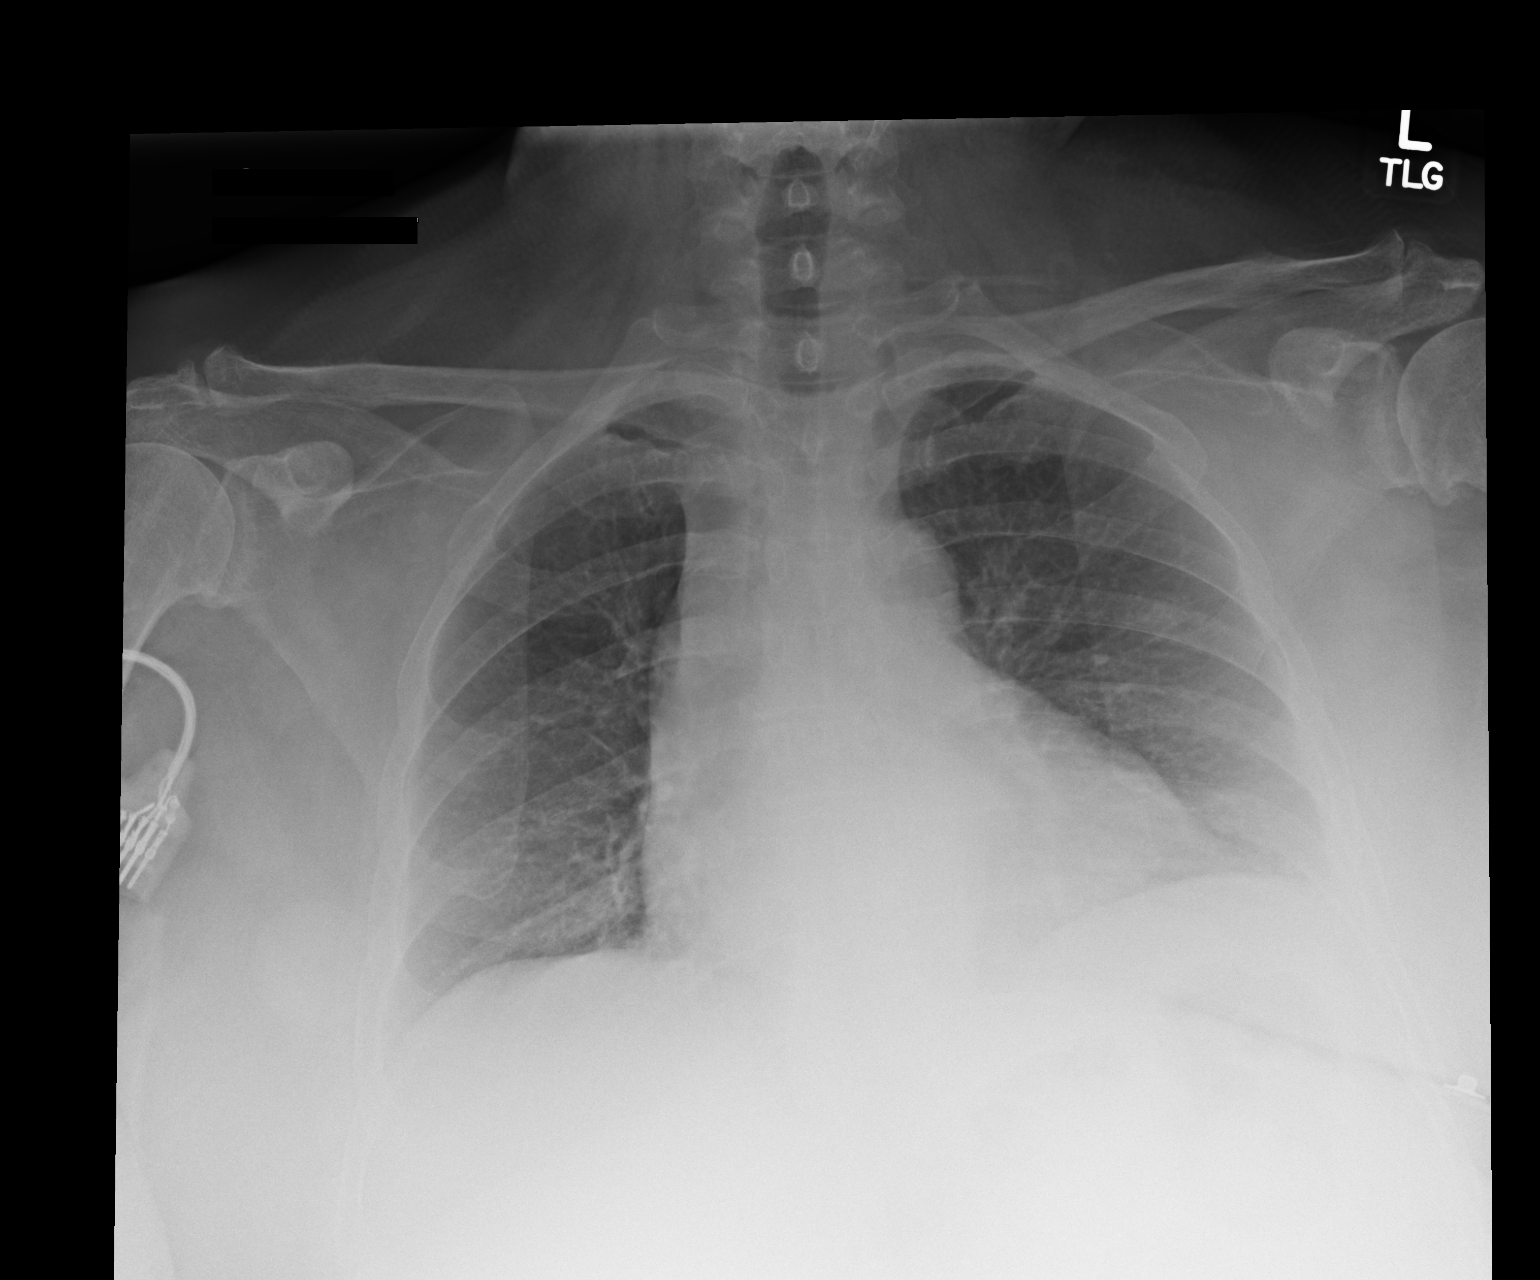

[1 of 1 positions shown; findings below may reference images not displayed]

FINDINGS: Heart size upper limits normal for technique. There are
low lung volumes with resultant crowding of bronchovascular
structures.  No focal infiltrate.  No overt edema.  No effusion.
Degenerative spurring in bilateral shoulders.
IMPRESSION: 1.  Low volumes.  No acute disease.

## 2014-04-13 IMAGING — CR DG CHEST 2V
2 series · 2 of 2 positions shown · non-contrast
Comparison: 09/09/2012; 09/02/2012; 04/27/2012

CLINICAL DATA: Wheezing, history of asthma, treat cardiac
catheterization

CHEST - 2 VIEW

[w chest lat]
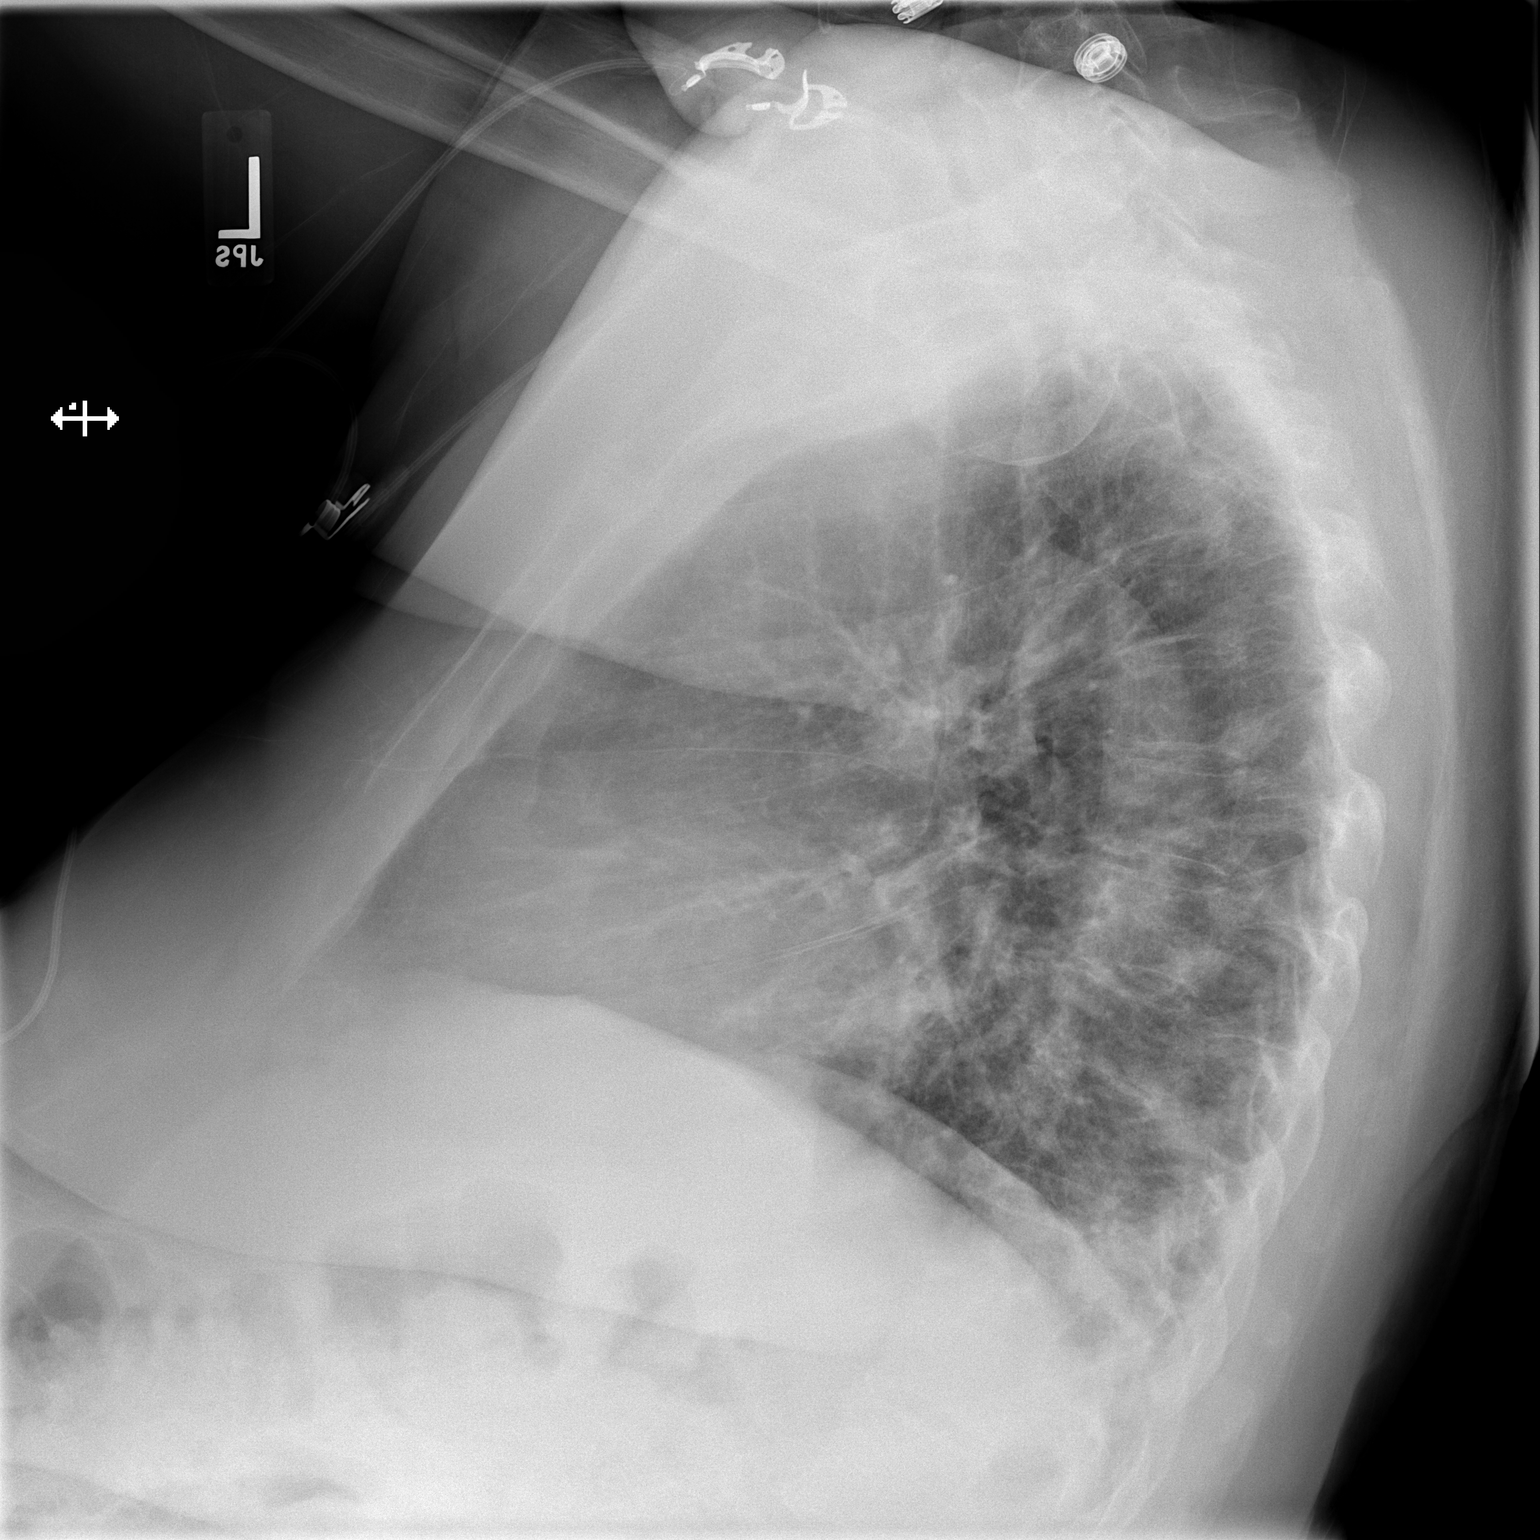

[x chest ap]
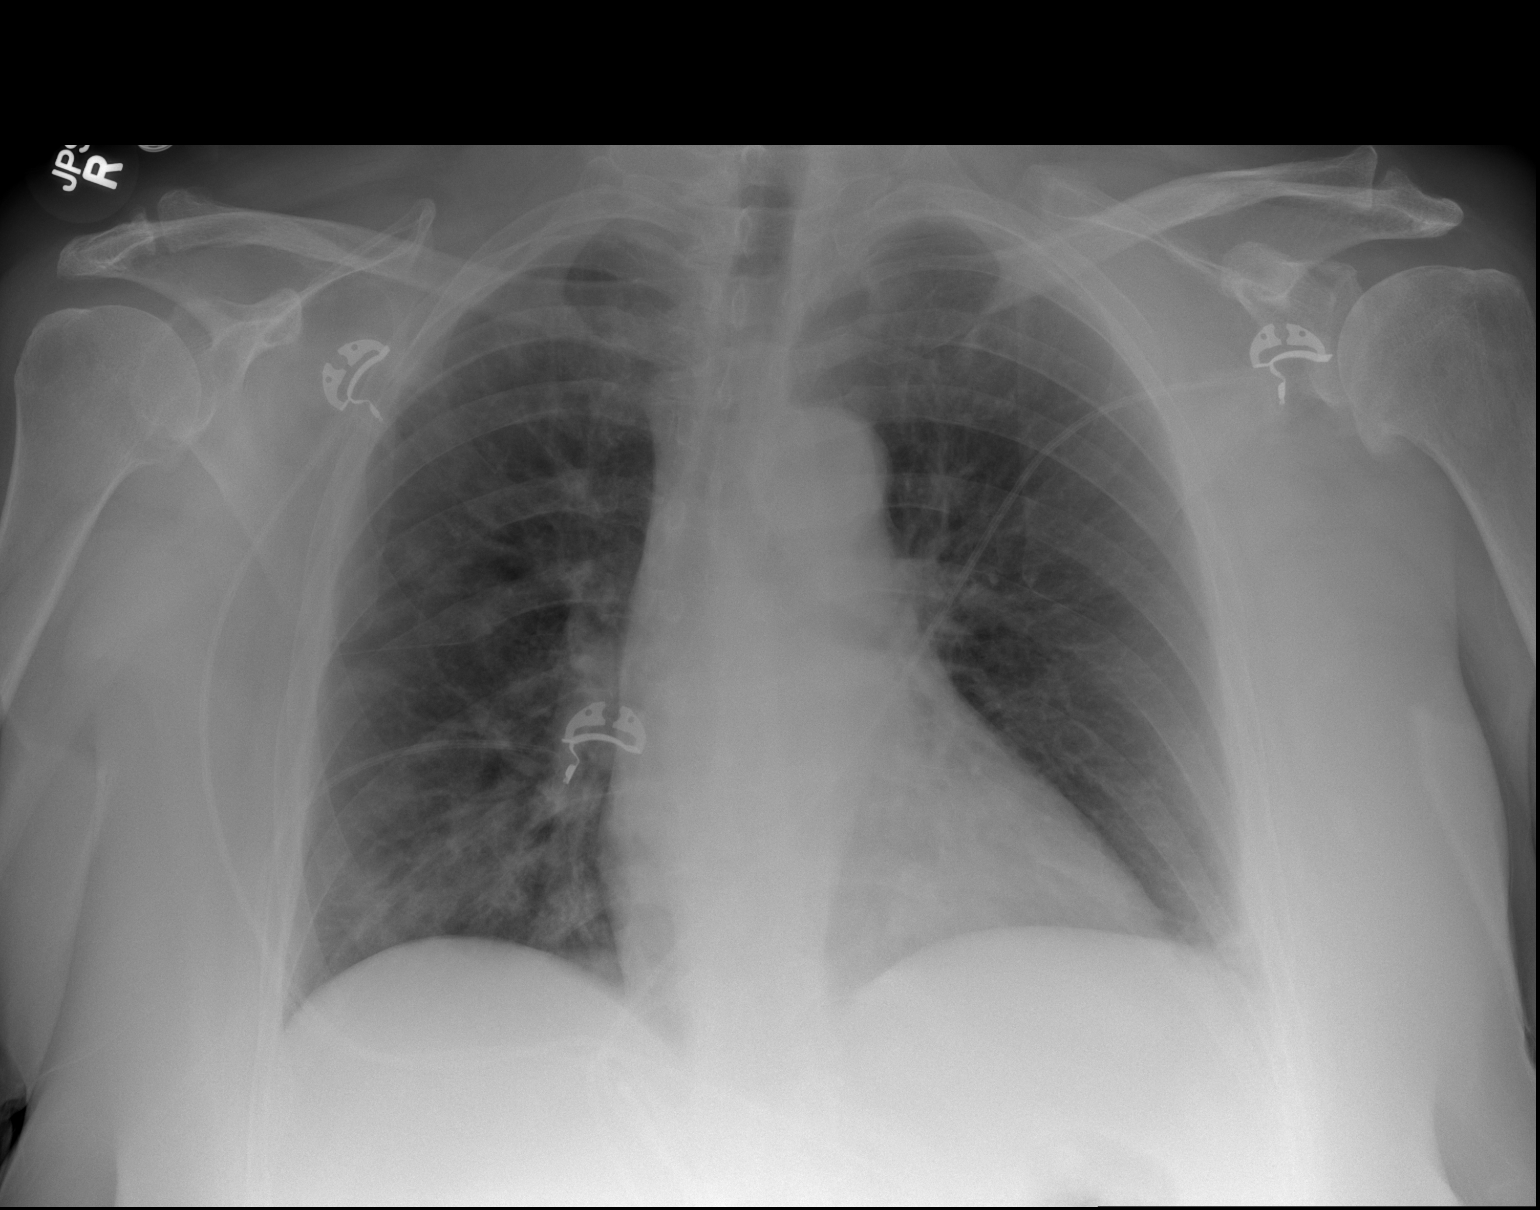

[2 of 2 positions shown; findings below may reference images not displayed]

FINDINGS: Grossly unchanged cardiac silhouette and mediastinal
contours.  Improved inspiratory effort.  Interval development of
ill-defined heterogeneous nodular opacities within the right mid
and lower lung.  The left hemithorax is unchanged without focal
airspace opacity.  No pleural effusion or pneumothorax.  Unchanged
bones.
IMPRESSION: Interval development of ill-defined slightly nodular heterogeneous
opacity within the right mid lung, nonspecific but could be seen in
the setting of early infection, including atypical etiologies.  A
follow-up chest radiograph in 4 to 6 weeks after treatment is
recommended to ensure resolution.

## 2014-06-16 ENCOUNTER — Encounter (HOSPITAL_COMMUNITY): Payer: Self-pay | Admitting: Cardiology

## 2015-12-15 DIAGNOSIS — I6783 Posterior reversible encephalopathy syndrome: Secondary | ICD-10-CM

## 2015-12-15 DIAGNOSIS — J189 Pneumonia, unspecified organism: Secondary | ICD-10-CM | POA: Diagnosis not present

## 2015-12-15 DIAGNOSIS — I2510 Atherosclerotic heart disease of native coronary artery without angina pectoris: Secondary | ICD-10-CM | POA: Diagnosis not present

## 2015-12-15 DIAGNOSIS — G8929 Other chronic pain: Secondary | ICD-10-CM | POA: Diagnosis not present

## 2015-12-15 DIAGNOSIS — I10 Essential (primary) hypertension: Secondary | ICD-10-CM | POA: Diagnosis not present

## 2015-12-15 DIAGNOSIS — J690 Pneumonitis due to inhalation of food and vomit: Secondary | ICD-10-CM

## 2015-12-15 DIAGNOSIS — I674 Hypertensive encephalopathy: Secondary | ICD-10-CM

## 2015-12-15 DIAGNOSIS — N179 Acute kidney failure, unspecified: Secondary | ICD-10-CM

## 2015-12-16 DIAGNOSIS — J189 Pneumonia, unspecified organism: Secondary | ICD-10-CM | POA: Diagnosis not present

## 2015-12-16 DIAGNOSIS — G8929 Other chronic pain: Secondary | ICD-10-CM | POA: Diagnosis not present

## 2015-12-16 DIAGNOSIS — J690 Pneumonitis due to inhalation of food and vomit: Secondary | ICD-10-CM | POA: Diagnosis not present

## 2015-12-16 DIAGNOSIS — N179 Acute kidney failure, unspecified: Secondary | ICD-10-CM | POA: Diagnosis not present

## 2015-12-17 DIAGNOSIS — N179 Acute kidney failure, unspecified: Secondary | ICD-10-CM | POA: Diagnosis not present

## 2015-12-17 DIAGNOSIS — J690 Pneumonitis due to inhalation of food and vomit: Secondary | ICD-10-CM | POA: Diagnosis not present

## 2015-12-17 DIAGNOSIS — G8929 Other chronic pain: Secondary | ICD-10-CM | POA: Diagnosis not present

## 2015-12-17 DIAGNOSIS — J189 Pneumonia, unspecified organism: Secondary | ICD-10-CM | POA: Diagnosis not present

## 2016-01-15 DIAGNOSIS — E872 Acidosis: Secondary | ICD-10-CM

## 2016-01-15 DIAGNOSIS — I10 Essential (primary) hypertension: Secondary | ICD-10-CM | POA: Diagnosis not present

## 2016-01-15 DIAGNOSIS — E669 Obesity, unspecified: Secondary | ICD-10-CM

## 2016-01-15 DIAGNOSIS — G8929 Other chronic pain: Secondary | ICD-10-CM

## 2016-01-15 DIAGNOSIS — N179 Acute kidney failure, unspecified: Secondary | ICD-10-CM

## 2016-01-15 DIAGNOSIS — N19 Unspecified kidney failure: Secondary | ICD-10-CM

## 2016-01-15 DIAGNOSIS — D72829 Elevated white blood cell count, unspecified: Secondary | ICD-10-CM

## 2016-01-15 DIAGNOSIS — R571 Hypovolemic shock: Secondary | ICD-10-CM

## 2016-01-15 DIAGNOSIS — G3184 Mild cognitive impairment, so stated: Secondary | ICD-10-CM

## 2016-01-16 DIAGNOSIS — G3184 Mild cognitive impairment, so stated: Secondary | ICD-10-CM | POA: Diagnosis not present

## 2016-01-16 DIAGNOSIS — I10 Essential (primary) hypertension: Secondary | ICD-10-CM | POA: Diagnosis not present

## 2016-01-16 DIAGNOSIS — R571 Hypovolemic shock: Secondary | ICD-10-CM | POA: Diagnosis not present

## 2016-01-16 DIAGNOSIS — E669 Obesity, unspecified: Secondary | ICD-10-CM | POA: Diagnosis not present

## 2016-01-17 DIAGNOSIS — R571 Hypovolemic shock: Secondary | ICD-10-CM

## 2016-01-17 DIAGNOSIS — E782 Mixed hyperlipidemia: Secondary | ICD-10-CM

## 2016-01-17 DIAGNOSIS — N179 Acute kidney failure, unspecified: Secondary | ICD-10-CM | POA: Diagnosis not present

## 2016-01-17 DIAGNOSIS — I10 Essential (primary) hypertension: Secondary | ICD-10-CM | POA: Diagnosis not present

## 2016-01-18 DIAGNOSIS — E782 Mixed hyperlipidemia: Secondary | ICD-10-CM | POA: Diagnosis not present

## 2016-01-18 DIAGNOSIS — I10 Essential (primary) hypertension: Secondary | ICD-10-CM | POA: Diagnosis not present

## 2016-01-18 DIAGNOSIS — R571 Hypovolemic shock: Secondary | ICD-10-CM | POA: Diagnosis not present

## 2016-01-18 DIAGNOSIS — N179 Acute kidney failure, unspecified: Secondary | ICD-10-CM | POA: Diagnosis not present

## 2016-04-01 DIAGNOSIS — R55 Syncope and collapse: Secondary | ICD-10-CM

## 2016-04-01 DIAGNOSIS — R001 Bradycardia, unspecified: Secondary | ICD-10-CM

## 2016-04-01 DIAGNOSIS — I959 Hypotension, unspecified: Secondary | ICD-10-CM

## 2016-04-02 DIAGNOSIS — R001 Bradycardia, unspecified: Secondary | ICD-10-CM | POA: Diagnosis not present

## 2016-04-02 DIAGNOSIS — I959 Hypotension, unspecified: Secondary | ICD-10-CM | POA: Diagnosis not present

## 2016-04-02 DIAGNOSIS — R55 Syncope and collapse: Secondary | ICD-10-CM | POA: Diagnosis not present

## 2016-04-03 DIAGNOSIS — I959 Hypotension, unspecified: Secondary | ICD-10-CM | POA: Diagnosis not present

## 2016-04-03 DIAGNOSIS — N390 Urinary tract infection, site not specified: Secondary | ICD-10-CM

## 2016-04-03 DIAGNOSIS — R001 Bradycardia, unspecified: Secondary | ICD-10-CM | POA: Diagnosis not present

## 2016-04-03 DIAGNOSIS — R55 Syncope and collapse: Secondary | ICD-10-CM | POA: Diagnosis not present

## 2016-10-31 DIAGNOSIS — Z01818 Encounter for other preprocedural examination: Secondary | ICD-10-CM | POA: Diagnosis not present

## 2016-10-31 DIAGNOSIS — E669 Obesity, unspecified: Secondary | ICD-10-CM | POA: Diagnosis not present

## 2016-10-31 DIAGNOSIS — K8000 Calculus of gallbladder with acute cholecystitis without obstruction: Secondary | ICD-10-CM

## 2016-10-31 DIAGNOSIS — R1011 Right upper quadrant pain: Secondary | ICD-10-CM | POA: Diagnosis not present

## 2016-10-31 DIAGNOSIS — I160 Hypertensive urgency: Secondary | ICD-10-CM

## 2016-10-31 DIAGNOSIS — I10 Essential (primary) hypertension: Secondary | ICD-10-CM | POA: Diagnosis not present

## 2016-11-01 DIAGNOSIS — I160 Hypertensive urgency: Secondary | ICD-10-CM | POA: Diagnosis not present

## 2016-11-01 DIAGNOSIS — R1011 Right upper quadrant pain: Secondary | ICD-10-CM | POA: Diagnosis not present

## 2016-11-01 DIAGNOSIS — K8000 Calculus of gallbladder with acute cholecystitis without obstruction: Secondary | ICD-10-CM | POA: Diagnosis not present

## 2016-11-01 DIAGNOSIS — I10 Essential (primary) hypertension: Secondary | ICD-10-CM | POA: Diagnosis not present

## 2016-11-01 DIAGNOSIS — E669 Obesity, unspecified: Secondary | ICD-10-CM | POA: Diagnosis not present

## 2016-11-01 DIAGNOSIS — Z01818 Encounter for other preprocedural examination: Secondary | ICD-10-CM | POA: Diagnosis not present

## 2016-11-02 DIAGNOSIS — I10 Essential (primary) hypertension: Secondary | ICD-10-CM | POA: Diagnosis not present

## 2016-11-02 DIAGNOSIS — E669 Obesity, unspecified: Secondary | ICD-10-CM | POA: Diagnosis not present

## 2016-11-02 DIAGNOSIS — K8000 Calculus of gallbladder with acute cholecystitis without obstruction: Secondary | ICD-10-CM | POA: Diagnosis not present

## 2016-11-02 DIAGNOSIS — I160 Hypertensive urgency: Secondary | ICD-10-CM | POA: Diagnosis not present

## 2016-11-02 DIAGNOSIS — Z01818 Encounter for other preprocedural examination: Secondary | ICD-10-CM | POA: Diagnosis not present

## 2016-11-02 DIAGNOSIS — R1011 Right upper quadrant pain: Secondary | ICD-10-CM | POA: Diagnosis not present

## 2016-11-03 DIAGNOSIS — K8000 Calculus of gallbladder with acute cholecystitis without obstruction: Secondary | ICD-10-CM | POA: Diagnosis not present

## 2016-11-03 DIAGNOSIS — I160 Hypertensive urgency: Secondary | ICD-10-CM | POA: Diagnosis not present

## 2016-11-03 DIAGNOSIS — I10 Essential (primary) hypertension: Secondary | ICD-10-CM | POA: Diagnosis not present

## 2016-11-03 DIAGNOSIS — Z01818 Encounter for other preprocedural examination: Secondary | ICD-10-CM | POA: Diagnosis not present

## 2016-11-03 DIAGNOSIS — E669 Obesity, unspecified: Secondary | ICD-10-CM | POA: Diagnosis not present

## 2016-11-03 DIAGNOSIS — R1011 Right upper quadrant pain: Secondary | ICD-10-CM | POA: Diagnosis not present

## 2016-11-04 DIAGNOSIS — I160 Hypertensive urgency: Secondary | ICD-10-CM | POA: Diagnosis not present

## 2016-11-04 DIAGNOSIS — E669 Obesity, unspecified: Secondary | ICD-10-CM | POA: Diagnosis not present

## 2016-11-04 DIAGNOSIS — Z01818 Encounter for other preprocedural examination: Secondary | ICD-10-CM | POA: Diagnosis not present

## 2016-11-04 DIAGNOSIS — K8000 Calculus of gallbladder with acute cholecystitis without obstruction: Secondary | ICD-10-CM | POA: Diagnosis not present

## 2016-11-04 DIAGNOSIS — I10 Essential (primary) hypertension: Secondary | ICD-10-CM | POA: Diagnosis not present

## 2016-11-04 DIAGNOSIS — R1011 Right upper quadrant pain: Secondary | ICD-10-CM | POA: Diagnosis not present

## 2017-02-05 DEATH — deceased
